# Patient Record
Sex: Male | Born: 1965 | Race: White | Hispanic: No | Marital: Single | Smoking: Current every day smoker
Health system: Southern US, Community
[De-identification: ages and names within clinical notes are randomized; demographics above are authoritative.]

## PROBLEM LIST (undated history)

## (undated) DIAGNOSIS — Z789 Other specified health status: Secondary | ICD-10-CM

## (undated) DIAGNOSIS — R17 Unspecified jaundice: Secondary | ICD-10-CM

## (undated) HISTORY — DX: Unspecified jaundice: R17

## (undated) HISTORY — PX: NO PAST SURGERIES: SHX2092

---

## 2007-10-27 ENCOUNTER — Emergency Department: Payer: Self-pay | Admitting: Emergency Medicine

## 2008-04-04 ENCOUNTER — Emergency Department: Payer: Self-pay | Admitting: Unknown Physician Specialty

## 2009-08-04 ENCOUNTER — Emergency Department: Payer: Self-pay | Admitting: Internal Medicine

## 2009-09-03 ENCOUNTER — Emergency Department: Payer: Self-pay | Admitting: Emergency Medicine

## 2010-10-05 ENCOUNTER — Emergency Department: Payer: Self-pay | Admitting: Emergency Medicine

## 2011-01-02 ENCOUNTER — Emergency Department: Payer: Self-pay | Admitting: Emergency Medicine

## 2012-03-30 ENCOUNTER — Emergency Department: Payer: Self-pay | Admitting: Emergency Medicine

## 2012-12-03 ENCOUNTER — Emergency Department: Payer: Self-pay | Admitting: Emergency Medicine

## 2012-12-06 ENCOUNTER — Emergency Department: Payer: Self-pay | Admitting: Emergency Medicine

## 2012-12-26 ENCOUNTER — Emergency Department: Payer: Self-pay | Admitting: Emergency Medicine

## 2013-11-27 ENCOUNTER — Emergency Department: Payer: Self-pay | Admitting: Emergency Medicine

## 2014-05-14 ENCOUNTER — Emergency Department: Payer: Self-pay | Admitting: Emergency Medicine

## 2014-05-18 ENCOUNTER — Emergency Department: Payer: Self-pay | Admitting: Emergency Medicine

## 2015-08-17 ENCOUNTER — Emergency Department: Payer: No Typology Code available for payment source

## 2015-08-17 ENCOUNTER — Emergency Department
Admission: EM | Admit: 2015-08-17 | Discharge: 2015-08-17 | Disposition: A | Discharge status: Home / Self Care | Payer: No Typology Code available for payment source | Attending: Emergency Medicine | Admitting: Emergency Medicine

## 2015-08-17 ENCOUNTER — Encounter: Payer: Self-pay | Admitting: Emergency Medicine

## 2015-08-17 DIAGNOSIS — F17200 Nicotine dependence, unspecified, uncomplicated: Secondary | ICD-10-CM | POA: Insufficient documentation

## 2015-08-17 DIAGNOSIS — M542 Cervicalgia: Secondary | ICD-10-CM | POA: Diagnosis present

## 2015-08-17 DIAGNOSIS — S1091XA Abrasion of unspecified part of neck, initial encounter: Secondary | ICD-10-CM | POA: Insufficient documentation

## 2015-08-17 DIAGNOSIS — Y999 Unspecified external cause status: Secondary | ICD-10-CM | POA: Insufficient documentation

## 2015-08-17 DIAGNOSIS — Y9355 Activity, bike riding: Secondary | ICD-10-CM | POA: Insufficient documentation

## 2015-08-17 DIAGNOSIS — M79671 Pain in right foot: Secondary | ICD-10-CM | POA: Diagnosis not present

## 2015-08-17 DIAGNOSIS — Y929 Unspecified place or not applicable: Secondary | ICD-10-CM | POA: Insufficient documentation

## 2015-08-17 LAB — BASIC METABOLIC PANEL
Anion gap: 6 (ref 5–15)
BUN: 17 mg/dL (ref 6–20)
CALCIUM: 9.7 mg/dL (ref 8.9–10.3)
CO2: 29 mmol/L (ref 22–32)
CREATININE: 1.2 mg/dL (ref 0.61–1.24)
Chloride: 107 mmol/L (ref 101–111)
Glucose, Bld: 100 mg/dL — ABNORMAL HIGH (ref 65–99)
Potassium: 4.6 mmol/L (ref 3.5–5.1)
Sodium: 142 mmol/L (ref 135–145)

## 2015-08-17 LAB — CBC WITH DIFFERENTIAL/PLATELET
BASOS ABS: 0.3 10*3/uL — AB (ref 0–0.1)
Basophils Relative: 4 %
Eosinophils Absolute: 0.3 10*3/uL (ref 0–0.7)
Eosinophils Relative: 4 %
HEMATOCRIT: 47.8 % (ref 40.0–52.0)
Hemoglobin: 16.5 g/dL (ref 13.0–18.0)
LYMPHS PCT: 16 %
Lymphs Abs: 1.5 10*3/uL (ref 1.0–3.6)
MCH: 30.7 pg (ref 26.0–34.0)
MCHC: 34.5 g/dL (ref 32.0–36.0)
MCV: 89.1 fL (ref 80.0–100.0)
Monocytes Absolute: 0.6 10*3/uL (ref 0.2–1.0)
Monocytes Relative: 6 %
NEUTROS ABS: 6.5 10*3/uL (ref 1.4–6.5)
Neutrophils Relative %: 70 %
Platelets: 191 10*3/uL (ref 150–440)
RBC: 5.36 MIL/uL (ref 4.40–5.90)
RDW: 13.2 % (ref 11.5–14.5)
WBC: 9.4 10*3/uL (ref 3.8–10.6)

## 2015-08-17 LAB — URIC ACID: URIC ACID, SERUM: 5.7 mg/dL (ref 4.4–7.6)

## 2015-08-17 MED ORDER — OXYCODONE-ACETAMINOPHEN 5-325 MG PO TABS: 1.0000 | ORAL_TABLET | ORAL | Status: DC | PRN

## 2015-08-17 MED ORDER — MORPHINE SULFATE (PF) 2 MG/ML IV SOLN
2.0000 mg | Freq: Once | INTRAVENOUS | Status: AC
  Administered 2015-08-17: 2 mg via INTRAVENOUS
  Filled 2015-08-17: qty 1

## 2015-08-17 MED ORDER — IOPAMIDOL (ISOVUE-370) INJECTION 76%
75.0000 mL | Freq: Once | INTRAVENOUS | Status: AC | PRN
  Administered 2015-08-17: 75 mL via INTRAVENOUS

## 2015-08-17 MED ORDER — INDOMETHACIN 50 MG PO CAPS: 50.0000 mg | ORAL_CAPSULE | Freq: Three times a day (TID) | ORAL | Status: DC

## 2015-08-17 NOTE — ED Notes (Signed)
Patient transported to CT 
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
## 2015-08-17 NOTE — ED Notes (Signed)
Returned from CT.
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
## 2015-08-17 NOTE — Discharge Instructions (Signed)
Please seek medical attention for any high fevers, chest pain, shortness of breath, change in behavior, persistent vomiting, bloody stool or any other new or concerning symptoms.  Gout Gout is when your joints become red, sore, and swell (inflamed). This is caused by the buildup of uric acid crystals in the joints. Uric acid is a chemical that is normally in the blood. If the level of uric acid gets too high in the blood, these crystals form in your joints and tissues. Over time, these crystals can form into masses near the joints and tissues. These masses can destroy bone and cause the bone to look misshapen (deformed). HOME CARE   Do not take aspirin for pain.  Only take medicine as told by your doctor.  Rest the joint as much as you can. When in bed, keep sheets and blankets off painful areas.  Keep the sore joints raised (elevated).  Put warm or cold packs on painful joints. Use of warm or cold packs depends on which works best for you.  Use crutches if the painful joint is in your leg.  Drink enough fluids to keep your pee (urine) clear or pale yellow. Limit alcohol, sugary drinks, and drinks with fructose in them.  Follow your diet instructions. Pay careful attention to how much protein you eat. Include fruits, vegetables, whole grains, and fat-free or low-fat milk products in your daily diet. Talk to your doctor or dietitian about the use of coffee, vitamin C, and cherries. These may help lower uric acid levels.  Keep a healthy body weight. GET HELP RIGHT AWAY IF:   You have watery poop (diarrhea), throw up (vomit), or have any side effects from medicines.  You do not feel better in 24 hours, or you are getting worse.  Your joint becomes suddenly more tender, and you have chills or a fever. MAKE SURE YOU:   Understand these instructions.  Will watch your condition.  Will get help right away if you are not doing well or get worse.   This information is not intended to  replace advice given to you by your health care provider. Make sure you discuss any questions you have with your health care provider.   Document Released: 12/01/2007 Document Revised: 03/14/2014 Document Reviewed: 10/05/2011 Elsevier Interactive Patient Education [employer]2016 [employer]vier Inc.

## 2015-08-17 NOTE — ED Notes (Signed)
[phone], Becky, gf of pt wants call when pt ready

## 2015-08-17 NOTE — ED Notes (Addendum)
Pain improved to 9/10

## 2015-08-17 NOTE — ED Notes (Signed)
Pt approaching staff from doorway to ask for pain meds and drink; this RNn to explain again d/t intoxication and possible surgery nothing att

## 2015-10-18 ENCOUNTER — Encounter: Payer: Self-pay | Admitting: Emergency Medicine

## 2015-10-18 ENCOUNTER — Emergency Department: Payer: Self-pay

## 2015-10-18 ENCOUNTER — Emergency Department
Admission: EM | Admit: 2015-10-18 | Discharge: 2015-10-18 | Payer: Self-pay | Attending: Student in an Organized Health Care Education/Training Program | Admitting: Student in an Organized Health Care Education/Training Program

## 2015-10-18 DIAGNOSIS — Y9389 Activity, other specified: Secondary | ICD-10-CM | POA: Insufficient documentation

## 2015-10-18 DIAGNOSIS — S06349A Traumatic hemorrhage of right cerebrum with loss of consciousness of unspecified duration, initial encounter: Secondary | ICD-10-CM

## 2015-10-18 DIAGNOSIS — F17200 Nicotine dependence, unspecified, uncomplicated: Secondary | ICD-10-CM | POA: Insufficient documentation

## 2015-10-18 DIAGNOSIS — S06361A Traumatic hemorrhage of cerebrum, unspecified, with loss of consciousness of 30 minutes or less, initial encounter: Secondary | ICD-10-CM | POA: Insufficient documentation

## 2015-10-18 DIAGNOSIS — Y990 Civilian activity done for income or pay: Secondary | ICD-10-CM | POA: Insufficient documentation

## 2015-10-18 DIAGNOSIS — G44311 Acute post-traumatic headache, intractable: Secondary | ICD-10-CM | POA: Insufficient documentation

## 2015-10-18 DIAGNOSIS — W1809XA Striking against other object with subsequent fall, initial encounter: Secondary | ICD-10-CM | POA: Insufficient documentation

## 2015-10-18 DIAGNOSIS — Y929 Unspecified place or not applicable: Secondary | ICD-10-CM | POA: Insufficient documentation

## 2015-10-18 LAB — BASIC METABOLIC PANEL
Anion gap: 5 (ref 5–15)
BUN: 15 mg/dL (ref 6–20)
CALCIUM: 9.2 mg/dL (ref 8.9–10.3)
CHLORIDE: 108 mmol/L (ref 101–111)
CO2: 24 mmol/L (ref 22–32)
CREATININE: 0.82 mg/dL (ref 0.61–1.24)
GFR calc non Af Amer: >60 mL/min (ref >60)
Glucose, Bld: 112 mg/dL — ABNORMAL HIGH (ref 65–99)
Potassium: 4.6 mmol/L (ref 3.5–5.1)
SODIUM: 137 mmol/L (ref 135–145)

## 2015-10-18 LAB — HEPATIC FUNCTION PANEL
ALK PHOS: 66 U/L (ref 38–126)
ALT: 28 U/L (ref 17–63)
AST: 34 U/L (ref 15–41)
Albumin: 4.1 g/dL (ref 3.5–5.0)
BILIRUBIN INDIRECT: 1 mg/dL — AB (ref 0.3–0.9)
BILIRUBIN TOTAL: 1.2 mg/dL (ref 0.3–1.2)
Bilirubin, Direct: 0.2 mg/dL (ref 0.1–0.5)
TOTAL PROTEIN: 7.1 g/dL (ref 6.5–8.1)

## 2015-10-18 LAB — CBC
HCT: 45.6 % (ref 40.0–52.0)
HEMOGLOBIN: 16.1 g/dL (ref 13.0–18.0)
MCH: 31.4 pg (ref 26.0–34.0)
MCHC: 35.2 g/dL (ref 32.0–36.0)
MCV: 89.2 fL (ref 80.0–100.0)
Platelets: 169 10*3/uL (ref 150–440)
RBC: 5.11 MIL/uL (ref 4.40–5.90)
RDW: 13.6 % (ref 11.5–14.5)
WBC: 7.3 10*3/uL (ref 3.8–10.6)

## 2015-10-18 LAB — ACETAMINOPHEN LEVEL

## 2015-10-18 MED ORDER — SODIUM CHLORIDE 0.9 % IV BOLUS (SEPSIS)
1000.0000 mL | Freq: Once | INTRAVENOUS | Status: AC
  Administered 2015-10-18: 1000 mL via INTRAVENOUS

## 2015-10-18 MED ORDER — DIPHENHYDRAMINE HCL 50 MG/ML IJ SOLN
25.0000 mg | Freq: Once | INTRAMUSCULAR | Status: AC
  Administered 2015-10-18: 25 mg via INTRAVENOUS
  Filled 2015-10-18: qty 1

## 2015-10-18 MED ORDER — ACETAMINOPHEN 500 MG PO TABS
1000.0000 mg | ORAL_TABLET | Freq: Once | ORAL | Status: DC
  Filled 2015-10-18: qty 2

## 2015-10-18 MED ORDER — METOCLOPRAMIDE HCL 5 MG/ML IJ SOLN
10.0000 mg | Freq: Once | INTRAMUSCULAR | Status: AC
  Administered 2015-10-18: 10 mg via INTRAVENOUS
  Filled 2015-10-18: qty 2

## 2015-10-18 MED ORDER — FENTANYL CITRATE (PF) 100 MCG/2ML IJ SOLN
100.0000 ug | INTRAMUSCULAR | Status: DC | PRN
Start: 2015-10-18 — End: 2015-10-19
  Administered 2015-10-18 (×2): 100 ug via INTRAVENOUS
  Filled 2015-10-18 (×2): qty 2

## 2015-10-18 NOTE — ED Notes (Signed)
[facility]. Pt [facility] to stretcher [facility]

## 2015-10-18 NOTE — ED Notes (Signed)
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 Pt states he did have some LOC. Ecchymosis noted around right eye. Pt states it is hard for him to lay down due to discomfort.  Pt states he has been taking Tylenol and his grandmother gave him some percocet which did not help with the pain either.

## 2015-10-18 NOTE — ED Notes (Signed)
Pt reports pain at 9/10 at this time. Pain medication offered.

## 2015-10-18 NOTE — ED Triage Notes (Signed)
Pt presents to ED for a fall after painting on Thursday; pt was not seen.  Pt states his head "hurts so bad" where he can't lay down for long. Presents with right eye bruising as well. " I think I blacked out or something". Pain 10/10
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
## 2015-10-18 NOTE — ED Notes (Signed)
Attempted IV access x1 unsuccessful. Primary RN to attempt.

## 2015-10-18 NOTE — ED Notes (Signed)
[facility].

## 2015-10-19 ENCOUNTER — Emergency Department
Admit: 2015-10-19 | Discharge: 2015-10-19 | Discharge status: Absent without leave | Payer: No Typology Code available for payment source

## 2015-10-19 DIAGNOSIS — S06339A Contusion and laceration of cerebrum, unspecified, with loss of consciousness of unspecified duration, initial encounter: Secondary | ICD-10-CM | POA: Insufficient documentation

## 2018-01-19 ENCOUNTER — Emergency Department
Admission: EM | Admit: 2018-01-19 | Discharge: 2018-01-19 | Disposition: A | Discharge status: Home / Self Care | Payer: Self-pay | Attending: Emergency Medicine | Admitting: Emergency Medicine

## 2018-01-19 ENCOUNTER — Encounter: Payer: Self-pay | Admitting: Emergency Medicine

## 2018-01-19 ENCOUNTER — Other Ambulatory Visit: Payer: Self-pay

## 2018-01-19 DIAGNOSIS — F17200 Nicotine dependence, unspecified, uncomplicated: Secondary | ICD-10-CM | POA: Insufficient documentation

## 2018-01-19 DIAGNOSIS — Z79899 Other long term (current) drug therapy: Secondary | ICD-10-CM | POA: Insufficient documentation

## 2018-01-19 DIAGNOSIS — K029 Dental caries, unspecified: Secondary | ICD-10-CM | POA: Insufficient documentation

## 2018-01-19 DIAGNOSIS — K0889 Other specified disorders of teeth and supporting structures: Secondary | ICD-10-CM | POA: Insufficient documentation

## 2018-01-19 MED ORDER — IBUPROFEN 600 MG PO TABS: 600.0000 mg | ORAL_TABLET | Freq: Three times a day (TID) | ORAL | 0 refills | Status: DC | PRN

## 2018-01-19 MED ORDER — LIDOCAINE VISCOUS HCL 2 % MT SOLN
15.0000 mL | Freq: Once | OROMUCOSAL | Status: AC
  Administered 2018-01-19: 15 mL via OROMUCOSAL
  Filled 2018-01-19: qty 15

## 2018-01-19 MED ORDER — AMOXICILLIN 500 MG PO CAPS: 500.0000 mg | ORAL_CAPSULE | Freq: Three times a day (TID) | ORAL | 0 refills | Status: DC

## 2018-01-19 MED ORDER — OXYCODONE-ACETAMINOPHEN 7.5-325 MG PO TABS: 1.0000 | ORAL_TABLET | Freq: Four times a day (QID) | ORAL | 0 refills | Status: DC | PRN

## 2018-01-19 NOTE — ED Provider Notes (Signed)
[facility] Emergency Department Provider Note   ____________________________________________   First MD Initiated Contact with Patient 01/19/18 1509     (approximate)  I have reviewed the triage vital signs and the nursing notes.   HISTORY  Chief Complaint Dental Pain    HPI Ross Reed is a 52 y.o. male patient complained of dental pain which began last night.  Patient also has some mild edema to the right lateral aspect of the face.  Patient has a long history of dental neglect.  Patient denies fever associated this complaint.  Patient denies any drainage.  Patient rates the pain as a 10/10.  Patient described the pain is "ache".  No palliative measure for complaint.   History reviewed. No pertinent past medical history.  There are no active problems to display for this patient.   History reviewed. No pertinent surgical history.  Prior to Admission medications   Medication Sig Start Date End Date Taking? Authorizing Provider  amoxicillin (AMOXIL) 500 MG capsule Take 1 capsule (500 mg total) by mouth 3 (three) times daily. 01/19/18   Joni Reining, PA-C  ibuprofen (ADVIL,MOTRIN) 600 MG tablet Take 1 tablet (600 mg total) by mouth every 8 (eight) hours as needed. 01/19/18   Joni Reining, PA-C  indomethacin (INDOCIN) 50 MG capsule Take 1 capsule (50 mg total) by mouth 3 (three) times daily with meals. 08/17/15   Phineas Semen, MD  oxyCODONE-acetaminophen (PERCOCET) 7.5-325 MG tablet Take 1 tablet by mouth every 6 (six) hours as needed. 01/19/18   Joni Reining, PA-C  oxyCODONE-acetaminophen (ROXICET) 5-325 MG tablet Take 1 tablet by mouth every 4 (four) hours as needed for severe pain. 08/17/15   Phineas Semen, MD    Allergies Patient has no known allergies.  No family history on file.  Social History Social History   Tobacco Use  . Smoking status: Current Every Day Smoker  . Smokeless tobacco: Never Used  Substance Use Topics  .  Alcohol use: Yes  . Drug use: Not on file    Review of Systems Constitutional: No fever/chills Eyes: No visual changes. ENT: No sore throat.  Dental pain. Cardiovascular: Denies chest pain. Respiratory: Denies shortness of breath. Gastrointestinal: No abdominal pain.  No nausea, no vomiting.  No diarrhea.  No constipation. Genitourinary: Negative for dysuria. Musculoskeletal: Negative for back pain. Skin: Negative for rash. Neurological: Negative for headaches, focal weakness or numbness.   ____________________________________________   PHYSICAL EXAM:  VITAL SIGNS: ED Triage Vitals  Enc Vitals Group     BP 01/19/18 1507 (!) 154/90     Pulse Rate 01/19/18 1507 91     Resp 01/19/18 1507 18     Temp 01/19/18 1507 97.7 F (36.5 C)     Temp Source 01/19/18 1507 Oral     SpO2 01/19/18 1507 98 %     Weight 01/19/18 1508 192 lb (87.1 kg)     Height 01/19/18 1508 6\' 3"  (1.905 m)     Head Circumference --      Peak Flow --      Pain Score 01/19/18 1508 10     Pain Loc --      Pain Edu? --      Excl. in GC? --    Constitutional: Alert and oriented. Well appearing and in no acute distress. Eyes: Conjunctivae are normal. PERRL. EOMI. Head: Atraumatic. Nose: No congestion/rhinnorhea. Mouth/Throat: Mucous membranes are moist.  Oropharynx non-erythematous.  Multiple fractures and caries dental cavity.  Mild gingival edema. Neck: No stridor.  Hematological/Lymphatic/Immunilogical: No cervical lymphadenopathy. Cardiovascular: Normal rate, regular rhythm. Grossly normal heart sounds.  Good peripheral circulation. Respiratory: Normal respiratory effort.  No retractions. Lungs CTAB.  ____________________________________________   LABS (all labs ordered are listed, but only abnormal results are displayed)  Labs Reviewed - No data to display ____________________________________________  EKG   ____________________________________________  RADIOLOGY  ED MD interpretation:     Official radiology report(s): No results found.  ____________________________________________   PROCEDURES  Procedure(s) performed: None  Procedures  Critical Care performed: No  ____________________________________________   INITIAL IMPRESSION / ASSESSMENT AND PLAN / ED COURSE  As part of my medical decision making, I reviewed the following data within the electronic MEDICAL RECORD NUMBER    Teeth pain secondary to poor dental hygiene and multiple caries.  Patient given discharge care instruction advised follow-up from list of dental clinics provided.      ____________________________________________   FINAL CLINICAL IMPRESSION(S) / ED DIAGNOSES  Final diagnoses:  Pain due to dental caries     ED Discharge Orders         Ordered    amoxicillin (AMOXIL) 500 MG capsule  3 times daily     01/19/18 1534    oxyCODONE-acetaminophen (PERCOCET) 7.5-325 MG tablet  Every 6 hours PRN     01/19/18 1534    ibuprofen (ADVIL,MOTRIN) 600 MG tablet  Every 8 hours PRN     01/19/18 1534           Note:  This document was prepared using Dragon voice recognition software and may include unintentional dictation errors.    Joni ReiningSmith, Stevana Dufner K, PA-C 01/19/18 1539    Jeanmarie PlantMcShane, James A, MD 01/19/18 [phone]

## 2018-01-19 NOTE — Discharge Instructions (Addendum)
Take medication as directed follow-up from list of dental clinics provided. °OPTIONS FOR DENTAL FOLLOW UP CARE ° °[place] Department of Health and Human Services - Local Safety Net Dental Clinics °http://www.ncdhhs.gov/dph/oralhealth/services/safetynetclinics.htm °  °[facility] (336-562-3123) ° °[facility] ([phone]) ° °[facility] ([phone] ext [phone]) ° °[facility] ([phone]) ° °[facility] ([phone]) °This clinic caters to the indigent population and is on a lottery system. °Location: °[facility], [address], [place] °Clinic Hours: °Wednesdays from 6pm - 9pm, patients seen by a lottery system. °For dates, call or go to www.med.[facility].edu/[facility]/patients/Dental-[facility] °Services: °Cleanings, fillings and simple extractions. °Payment Options: °DENTAL WORK IS FREE OF CHARGE. Bring proof of income or support. °Best way to get seen: °Arrive at 5:15 pm - this is a lottery, NOT first come/first serve, so arriving earlier will not increase your chances of being seen. °  °  °[facility] °[phone] °Select option 1 for emergencies °  °Location: °[facility], [address], [place] °Clinic Hours: °No walk-ins accepted - call the day before to schedule an appointment. °Check in times are 9:30 am and 1:30 pm. °Services: °Simple extractions, temporary fillings, pulpectomy/pulp debridement, uncomplicated abscess drainage. °Payment Options: °PAYMENT IS DUE AT THE TIME OF SERVICE.  Fee is usually $100-200, additional surgical procedures (e.g. abscess drainage) may be extra. °Cash, checks, Visa/MasterCard accepted.  Can file Medicaid if patient is covered for dental - patient should call case worker to check. °No discount for [facility] patients. °Best way to get seen: °MUST call the day before and get onto the schedule. Can usually be seen the next 1-2 days. No  walk-ins accepted. °  °  °[facility] °[phone] °  °Location: °[facility], [address], [place] °Clinic Hours: °M, W, Th, F 8am or 1:30pm, Tues 9a or 1:30 - first come/first served. °Services: °Simple extractions, temporary fillings, uncomplicated abscess drainage.  You do not need to be an [place] resident. °Payment Options: °PAYMENT IS DUE AT THE TIME OF SERVICE. °Dental insurance, otherwise sliding scale - bring proof of income or support. °Depending on income and treatment needed, cost is usually $50-200. °Best way to get seen: °Arrive early as it is first come/first served. °  °  °[facility] °[phone] °  °Location: °[address] °Clinic Hours: °Mon-Thu 8a-5p °Services: °Most basic dental services including extractions and fillings. °Payment Options: °PAYMENT IS DUE AT THE TIME OF SERVICE. °Sliding scale, up to 50% off - bring proof if income or support. °Medicaid with dental option accepted. °Best way to get seen: °Call to schedule an appointment, can usually be seen within 2 weeks OR they will try to see walk-ins - show up at 8a or 2p (you may have to wait). °  °  °[facility] °[phone] °[place] RESIDENTS ONLY °  °Location: °[facility], [address], [place], [place] [address] °Clinic Hours: By appointment only. °Monday - Thursday 8am-5pm, Friday 8am-12pm °Services: Cleanings, fillings, extractions. °Payment Options: °PAYMENT IS DUE AT THE TIME OF SERVICE. °Cash, Visa or MasterCard. Sliding scale - $30 minimum per service. °Best way to get seen: °Come in to office, complete packet and make an appointment - need proof of income °or support monies for each household member and proof of [place] residence. °Usually takes about a month to get in. °  °  °[facility] [phone]   Location: [address] [facility] Hours:  Walk-in Urgent Care Dental Services are offered Monday-Friday mornings only. The numbers of emergencies accepted daily is limited to the number of providers available. Maximum 15 - Mondays, Wednesdays & Thursdays Maximum 10 - Tuesdays & Fridays Services: You do not need to be [place] resident to be seen for a dental emergency. Emergencies are defined as pain, swelling, abnormal bleeding, or dental trauma. Walkins will receive x-rays if needed. NOTE: Dental cleaning is not an emergency. Payment Options: PAYMENT IS DUE AT THE TIME OF SERVICE. Minimum co-pay is $40.00 for uninsured patients. Minimum co-pay is $3.00 for Medicaid with dental coverage. Dental Insurance is accepted and must be presented at time of visit. Medicare does not cover dental. Forms of payment: Cash, credit card, checks. Best way to get seen: If not previously registered with the clinic, walk-in dental registration begins at 7:15 am and is on a first come/first serve basis. If previously registered with the clinic, call to make an appointment.     [facility] [phone] [place] RESIDENTS ONLY   Location: [address], [place], [place] Clinic Hours: Mon-Thu 10a-2p Services: Extractions only! Payment Options: FREE (donations accepted) - bring proof of income or support Best way to get seen: Call and schedule an appointment OR come at 8am on the 1st Monday of every month (except for holidays) when it is first come/first served.     [facility] [phone]   Location: [address], [place] [facility] Hours: Friday mornings Services, Payment Options, Best way to get seen: Call for info

## 2018-01-19 NOTE — ED Notes (Signed)
Pt swishing lidocaine at this time. Pt aware of discharge papers and plan. Pt remains in room until medication is complete.

## 2018-01-19 NOTE — ED Triage Notes (Signed)
Dental pain that began last night swollen right side of face.

## 2019-10-16 ENCOUNTER — Other Ambulatory Visit: Payer: Self-pay

## 2019-10-16 ENCOUNTER — Emergency Department
Admission: EM | Admit: 2019-10-16 | Discharge: 2019-10-16 | Disposition: A | Discharge status: Home / Self Care | Payer: Self-pay | Attending: Emergency Medicine | Admitting: Emergency Medicine

## 2019-10-16 ENCOUNTER — Encounter: Payer: Self-pay | Admitting: *Deleted

## 2019-10-16 DIAGNOSIS — Y999 Unspecified external cause status: Secondary | ICD-10-CM | POA: Insufficient documentation

## 2019-10-16 DIAGNOSIS — Y929 Unspecified place or not applicable: Secondary | ICD-10-CM | POA: Insufficient documentation

## 2019-10-16 DIAGNOSIS — S8992XA Unspecified injury of left lower leg, initial encounter: Secondary | ICD-10-CM | POA: Diagnosis not present

## 2019-10-16 DIAGNOSIS — F17210 Nicotine dependence, cigarettes, uncomplicated: Secondary | ICD-10-CM | POA: Insufficient documentation

## 2019-10-16 DIAGNOSIS — Y9355 Activity, bike riding: Secondary | ICD-10-CM | POA: Insufficient documentation

## 2019-10-16 DIAGNOSIS — S6992XA Unspecified injury of left wrist, hand and finger(s), initial encounter: Secondary | ICD-10-CM | POA: Diagnosis not present

## 2019-10-16 DIAGNOSIS — M25532 Pain in left wrist: Secondary | ICD-10-CM | POA: Diagnosis present

## 2019-10-16 MED ORDER — OXYCODONE-ACETAMINOPHEN 5-325 MG PO TABS: 1.0000 | ORAL_TABLET | Freq: Four times a day (QID) | ORAL | 0 refills | Status: DC | PRN

## 2019-10-16 NOTE — ED Notes (Signed)
Pt unable to sign E-signature due to signature pad malfunction. Pt verbalized understanding of d/c instructions and had no additional questions or concerns for this RN or provider. Pt left with d/c instructions and gathered all personal belongings from room and removed them prior to ED departure.   
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
## 2020-05-08 ENCOUNTER — Other Ambulatory Visit: Payer: Self-pay

## 2020-05-08 ENCOUNTER — Emergency Department
Admission: EM | Admit: 2020-05-08 | Discharge: 2020-05-08 | Disposition: A | Discharge status: Home / Self Care | Payer: BC Managed Care – PPO | Attending: Emergency Medicine | Admitting: Emergency Medicine

## 2020-05-08 ENCOUNTER — Encounter: Payer: Self-pay | Admitting: Emergency Medicine

## 2020-05-08 DIAGNOSIS — R21 Rash and other nonspecific skin eruption: Secondary | ICD-10-CM

## 2020-05-08 DIAGNOSIS — B070 Plantar wart: Secondary | ICD-10-CM | POA: Diagnosis not present

## 2020-05-08 DIAGNOSIS — F17200 Nicotine dependence, unspecified, uncomplicated: Secondary | ICD-10-CM | POA: Insufficient documentation

## 2020-05-08 MED ORDER — METHYLPREDNISOLONE SODIUM SUCC 125 MG IJ SOLR
125.0000 mg | Freq: Once | INTRAMUSCULAR | Status: AC
  Administered 2020-05-08: 125 mg via INTRAMUSCULAR
  Filled 2020-05-08: qty 2

## 2020-05-08 MED ORDER — HYDROXYZINE HCL 50 MG PO TABS
50.0000 mg | ORAL_TABLET | Freq: Once | ORAL | Status: AC
  Administered 2020-05-08: 50 mg via ORAL
  Filled 2020-05-08: qty 1

## 2020-05-08 MED ORDER — METHYLPREDNISOLONE 4 MG PO TBPK: ORAL_TABLET | ORAL | 0 refills | Status: DC

## 2020-05-08 MED ORDER — HYDROXYZINE HCL 50 MG PO TABS: 50.0000 mg | ORAL_TABLET | Freq: Three times a day (TID) | ORAL | 0 refills | Status: DC | PRN

## 2020-05-08 MED ORDER — SALICYLIC ACID 0.5 % EX PADS: 1.0000 | MEDICATED_PAD | Freq: Every day | CUTANEOUS | 1 refills | Status: DC

## 2020-05-08 NOTE — ED Notes (Signed)
Patient declined discharge vital signs. 

## 2020-05-08 NOTE — Discharge Instructions (Signed)
Follow discharge care instruction and call the dermatologist listed in your discharge.  Take medication as directed.

## 2020-05-08 NOTE — ED Triage Notes (Signed)
Pt c/o rash to trunk and legs, pt states rash x 1 week. Pt denies itching at this time. Pt with red bumps noted at this time. Pt states works at a [employer].

## 2020-05-08 NOTE — ED Provider Notes (Signed)
[facility] Emergency Department Provider Note   ____________________________________________   Event Date/Time   First MD Initiated Contact with Patient 05/08/20 1128     (approximate)  I have reviewed the triage vital signs and the nursing notes.   HISTORY  Chief Complaint Rash    HPI Denham Mose is a 55 y.o. male patient presents with diffuse rash during the neck and facial area for 1 week.  Patient stated no itchiness associated with the rash.  Patient that he works in a [employer].  Unsure for fall.  Allergen for complaint.  Patient also complain of lesion to bilateral plantar aspect of foot.  Patient states pain increased with weightbearing and ambulation.  No palliative measure for either complaint.         History reviewed. No pertinent past medical history.  There are no problems to display for this patient.   No past surgical history on file.  Prior to Admission medications   Medication Sig Start Date End Date Taking? Authorizing Provider  hydrOXYzine (ATARAX/VISTARIL) 50 MG tablet Take 1 tablet (50 mg total) by mouth 3 (three) times daily as needed. 05/08/20  Yes Joni Reining, PA-C  methylPREDNISolone (MEDROL DOSEPAK) 4 MG TBPK tablet Take Tapered dose as directed 05/08/20  Yes Joni Reining, PA-C  Salicylic Acid 0.5 % PADS Apply 1 patch topically daily. 05/08/20  Yes Joni Reining, PA-C    Allergies Patient has no known allergies.  No family history on file.  Social History Social History   Tobacco Use  . Smoking status: Current Every Day Smoker  . Smokeless tobacco: Never Used  Substance Use Topics  . Alcohol use: Yes    Comment: Daily  . Drug use: Yes    Types: Marijuana    Comment: Occ    Review of Systems Constitutional: No fever/chills Eyes: No visual changes. ENT: No sore throat. Cardiovascular: Denies chest pain. Respiratory: Denies shortness of breath. Gastrointestinal: No abdominal pain.  No nausea, no  vomiting.  No diarrhea.  No constipation. Genitourinary: Negative for dysuria. Musculoskeletal: Negative for back pain. Skin: Positive for rash. Neurological: Negative for headaches, focal weakness or numbness.  ____________________________________________   PHYSICAL EXAM:  VITAL SIGNS: ED Triage Vitals [05/08/20 1120]  Enc Vitals Group     BP 119/87     Pulse Rate 95     Resp 20     Temp 97.7 F (36.5 C)     Temp Source Oral     SpO2 97 %     Weight 212 lb (96.2 kg)     Height 6\' 3"  (1.905 m)     Head Circumference      Peak Flow      Pain Score 0     Pain Loc      Pain Edu?      Excl. in GC?    Constitutional: Alert and oriented. Well appearing and in no acute distress. Eyes: Conjunctivae are normal. PERRL. EOMI. Head: Atraumatic. Nose: No congestion/rhinnorhea. Mouth/Throat: Mucous membranes are moist.  Oropharynx non-erythematous. Neck: No stridor. Hematological/Lymphatic/Immunilogical: No cervical lymphadenopathy. Cardiovascular: Normal rate, regular rhythm. Grossly normal heart sounds.  Good peripheral circulation. Respiratory: Normal respiratory effort.  No retractions. Lungs CTAB. Gastrointestinal: Soft and nontender. No distention. No abdominal bruits. No CVA tenderness. Musculoskeletal: No lower extremity tenderness nor edema.  No joint effusions. Neurologic:  Normal speech and language. No gross focal neurologic deficits are appreciated. No gait instability. Skin:  Skin is warm, dry  and intact.  Diffuse maculopapular lesions on the body.  Patient also papular lesions on the bilateral foot consistent with plantars warts.   Psychiatric: Mood and affect are normal. Speech and behavior are normal.  ____________________________________________   LABS (all labs ordered are listed, but only abnormal results are displayed)  Labs Reviewed - No data to  display ____________________________________________  EKG   ____________________________________________  RADIOLOGY I, Joni Reining, personally viewed and evaluated these images (plain radiographs) as part of my medical decision making, as well as reviewing the written report by the radiologist.  ED MD interpretation:    Official radiology report(s): No results found.  ____________________________________________   PROCEDURES  Procedure(s) performed (including Critical Care):  Procedures   ____________________________________________   INITIAL IMPRESSION / ASSESSMENT AND PLAN / ED COURSE  As part of my medical decision making, I reviewed the following data within the electronic MEDICAL RECORD NUMBER         Patient presents with diffuse rash for 1 week status post work in [employer].  No itching associated complaint.  Patient also has lesions to the plantar aspect of the feet.  Patient complaint physical exam consistent with nonspecific contact dermatitis and plantars warts.  Patient given prescription for Medrol Dosepak and Atarax.  Patient consulted to dermatology for definitive evaluation.  Patient also given prescription for salicylic pads.      ____________________________________________   FINAL CLINICAL IMPRESSION(S) / ED DIAGNOSES  Final diagnoses:  Rash and nonspecific skin eruption  Plantar warts     ED Discharge Orders         Ordered    methylPREDNISolone (MEDROL DOSEPAK) 4 MG TBPK tablet        05/08/20 1153    hydrOXYzine (ATARAX/VISTARIL) 50 MG tablet  3 times daily PRN        05/08/20 1153    Salicylic Acid 0.5 % PADS  Daily        05/08/20 1153          *Please note:  Jarick Harkins was evaluated in Emergency Department on 05/08/2020 for the symptoms described in the history of present illness. He was evaluated in the context of the global COVID-19 pandemic, which necessitated consideration that the patient might be at risk for infection  with the SARS-CoV-2 virus that causes COVID-19. Institutional protocols and algorithms that pertain to the evaluation of patients at risk for COVID-19 are in a state of rapid change based on information released by regulatory bodies including the CDC and federal and state organizations. These policies and algorithms were followed during the patient's care in the ED.  Some ED evaluations and interventions may be delayed as a result of limited staffing during and the pandemic.*   Note:  This document was prepared using Dragon voice recognition software and may include unintentional dictation errors.    Joni Reining, PA-C 05/08/20 1202    Dionne Bucy, MD 05/08/20 [phone]

## 2020-05-08 NOTE — ED Notes (Signed)
See triage note  Presents with generalized rash    States he developed rash about 1 week ago  No resp distress

## 2021-06-13 ENCOUNTER — Other Ambulatory Visit: Payer: Self-pay

## 2021-06-13 ENCOUNTER — Emergency Department: Payer: 59

## 2021-06-13 ENCOUNTER — Emergency Department
Admission: EM | Admit: 2021-06-13 | Discharge: 2021-06-13 | Disposition: A | Discharge status: Home / Self Care | Payer: 59 | Attending: Emergency Medicine | Admitting: Emergency Medicine

## 2021-06-13 DIAGNOSIS — R69 Illness, unspecified: Secondary | ICD-10-CM | POA: Diagnosis not present

## 2021-06-13 DIAGNOSIS — Z0001 Encounter for general adult medical examination with abnormal findings: Secondary | ICD-10-CM | POA: Diagnosis not present

## 2021-06-13 DIAGNOSIS — Y906 Blood alcohol level of 120-199 mg/100 ml: Secondary | ICD-10-CM | POA: Insufficient documentation

## 2021-06-13 DIAGNOSIS — F10920 Alcohol use, unspecified with intoxication, uncomplicated: Secondary | ICD-10-CM

## 2021-06-13 DIAGNOSIS — Y92410 Unspecified street and highway as the place of occurrence of the external cause: Secondary | ICD-10-CM | POA: Diagnosis not present

## 2021-06-13 DIAGNOSIS — Z139 Encounter for screening, unspecified: Secondary | ICD-10-CM

## 2021-06-13 DIAGNOSIS — F10129 Alcohol abuse with intoxication, unspecified: Secondary | ICD-10-CM | POA: Diagnosis present

## 2021-06-13 LAB — CBC WITH DIFFERENTIAL/PLATELET
Abs Immature Granulocytes: 0.06 10*3/uL (ref 0.00–0.07)
Basophils Absolute: 0.1 10*3/uL (ref 0.0–0.1)
Basophils Relative: 1 %
Eosinophils Absolute: 0.3 10*3/uL (ref 0.0–0.5)
Eosinophils Relative: 3 %
HCT: 47.9 % (ref 39.0–52.0)
Hemoglobin: 16.1 g/dL (ref 13.0–17.0)
Immature Granulocytes: 1 %
Lymphocytes Relative: 26 %
Lymphs Abs: 2.3 10*3/uL (ref 0.7–4.0)
MCH: 29.9 pg (ref 26.0–34.0)
MCHC: 33.6 g/dL (ref 30.0–36.0)
MCV: 89 fL (ref 80.0–100.0)
Monocytes Absolute: 0.6 10*3/uL (ref 0.1–1.0)
Monocytes Relative: 7 %
Neutro Abs: 5.6 10*3/uL (ref 1.7–7.7)
Neutrophils Relative %: 62 %
Platelets: 250 10*3/uL (ref 150–400)
RBC: 5.38 MIL/uL (ref 4.22–5.81)
RDW: 12.4 % (ref 11.5–15.5)
WBC: 9 10*3/uL (ref 4.0–10.5)
nRBC: 0 % (ref 0.0–0.2)

## 2021-06-13 LAB — ETHANOL: Alcohol, Ethyl (B): 199 mg/dL — ABNORMAL HIGH (ref <10)

## 2021-06-13 LAB — COMPREHENSIVE METABOLIC PANEL
ALT: 55 U/L — ABNORMAL HIGH (ref 0–44)
AST: 48 U/L — ABNORMAL HIGH (ref 15–41)
Albumin: 4.1 g/dL (ref 3.5–5.0)
Alkaline Phosphatase: 77 U/L (ref 38–126)
Anion gap: 9 (ref 5–15)
BUN: 10 mg/dL (ref 6–20)
CO2: 24 mmol/L (ref 22–32)
Calcium: 9.2 mg/dL (ref 8.9–10.3)
Chloride: 105 mmol/L (ref 98–111)
Creatinine, Ser: 0.93 mg/dL (ref 0.61–1.24)
GFR, Estimated: >60 mL/min (ref >60)
Glucose, Bld: 109 mg/dL — ABNORMAL HIGH (ref 70–99)
Potassium: 4.2 mmol/L (ref 3.5–5.1)
Sodium: 138 mmol/L (ref 135–145)
Total Bilirubin: 0.8 mg/dL (ref 0.3–1.2)
Total Protein: 8.2 g/dL — ABNORMAL HIGH (ref 6.5–8.1)
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
## 2021-06-27 ENCOUNTER — Emergency Department
Admission: EM | Admit: 2021-06-27 | Discharge: 2021-06-27 | Disposition: A | Discharge status: Home / Self Care | Payer: 59 | Attending: Emergency Medicine | Admitting: Emergency Medicine

## 2021-06-27 ENCOUNTER — Emergency Department: Payer: 59

## 2021-06-27 ENCOUNTER — Other Ambulatory Visit: Payer: Self-pay

## 2021-06-27 DIAGNOSIS — S161XXA Strain of muscle, fascia and tendon at neck level, initial encounter: Secondary | ICD-10-CM | POA: Insufficient documentation

## 2021-06-27 DIAGNOSIS — R9431 Abnormal electrocardiogram [ECG] [EKG]: Secondary | ICD-10-CM | POA: Diagnosis not present

## 2021-06-27 DIAGNOSIS — S199XXA Unspecified injury of neck, initial encounter: Secondary | ICD-10-CM | POA: Diagnosis not present

## 2021-06-27 DIAGNOSIS — R519 Headache, unspecified: Secondary | ICD-10-CM | POA: Diagnosis not present

## 2021-06-27 DIAGNOSIS — K0889 Other specified disorders of teeth and supporting structures: Secondary | ICD-10-CM | POA: Insufficient documentation

## 2021-06-27 DIAGNOSIS — S39012A Strain of muscle, fascia and tendon of lower back, initial encounter: Secondary | ICD-10-CM | POA: Diagnosis not present

## 2021-06-27 DIAGNOSIS — M542 Cervicalgia: Secondary | ICD-10-CM | POA: Diagnosis not present

## 2021-06-27 DIAGNOSIS — Y92410 Unspecified street and highway as the place of occurrence of the external cause: Secondary | ICD-10-CM | POA: Insufficient documentation

## 2021-06-27 MED ORDER — CYCLOBENZAPRINE HCL 10 MG PO TABS: 10.0000 mg | ORAL_TABLET | Freq: Three times a day (TID) | ORAL | 0 refills | Status: AC | PRN

## 2021-06-27 MED ORDER — OXYCODONE-ACETAMINOPHEN 5-325 MG PO TABS: 1.0000 | ORAL_TABLET | Freq: Four times a day (QID) | ORAL | 0 refills | Status: AC | PRN

## 2021-06-27 MED ORDER — KETOROLAC TROMETHAMINE 15 MG/ML IJ SOLN
15.0000 mg | Freq: Once | INTRAMUSCULAR | Status: AC
  Administered 2021-06-27: 15 mg via INTRAMUSCULAR
  Filled 2021-06-27: qty 1

## 2021-06-27 MED ORDER — CYCLOBENZAPRINE HCL 10 MG PO TABS
5.0000 mg | ORAL_TABLET | Freq: Once | ORAL | Status: AC
  Administered 2021-06-27: 5 mg via ORAL
  Filled 2021-06-27: qty 1

## 2021-06-27 MED ORDER — LIDOCAINE 5 % EX PTCH: 1.0000 | MEDICATED_PATCH | Freq: Two times a day (BID) | CUTANEOUS | 11 refills | Status: DC
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
## 2021-06-27 NOTE — ED Notes (Signed)
Pt states he takes 2 percocet a day due to pain.  ?

## 2021-06-27 NOTE — Discharge Instructions (Addendum)
-  Take ibuprofen and lidocaine patches as needed for pain.  Use oxycodone/acetaminophen sparingly. ?-You may additionally take cyclobenzaprine for muscle relaxation. ?-Please follow-up with your dental provider, as discussed. ?-Return to the emergency department anytime if you begin to experience any new or worsening symptoms ?
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
## 2022-04-29 ENCOUNTER — Emergency Department: Payer: 59

## 2022-04-29 ENCOUNTER — Other Ambulatory Visit: Payer: Self-pay

## 2022-04-29 DIAGNOSIS — S36039A Unspecified laceration of spleen, initial encounter: Principal | ICD-10-CM | POA: Diagnosis present

## 2022-04-29 DIAGNOSIS — S41112A Laceration without foreign body of left upper arm, initial encounter: Secondary | ICD-10-CM | POA: Diagnosis present

## 2022-04-29 DIAGNOSIS — Z23 Encounter for immunization: Secondary | ICD-10-CM

## 2022-04-29 DIAGNOSIS — Z79899 Other long term (current) drug therapy: Secondary | ICD-10-CM

## 2022-04-29 DIAGNOSIS — S41119A Laceration without foreign body of unspecified upper arm, initial encounter: Secondary | ICD-10-CM | POA: Diagnosis not present

## 2022-04-29 DIAGNOSIS — Y9301 Activity, walking, marching and hiking: Secondary | ICD-10-CM | POA: Diagnosis present

## 2022-04-29 DIAGNOSIS — R9431 Abnormal electrocardiogram [ECG] [EKG]: Secondary | ICD-10-CM | POA: Diagnosis not present

## 2022-04-29 DIAGNOSIS — Y92410 Unspecified street and highway as the place of occurrence of the external cause: Secondary | ICD-10-CM
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
## 2022-04-30 ENCOUNTER — Encounter (HOSPITAL_COMMUNITY): Payer: Self-pay

## 2022-04-30 ENCOUNTER — Inpatient Hospital Stay (HOSPITAL_COMMUNITY)
Admission: EM | Admit: 2022-04-30 | Discharge: 2022-05-02 | DRG: 799 | Disposition: A | Discharge status: Home / Self Care | Payer: 59 | Source: Ambulatory Visit | Attending: Surgery | Admitting: Surgery

## 2022-04-30 ENCOUNTER — Encounter (HOSPITAL_COMMUNITY): Admission: EM | Disposition: A | Discharge status: Home / Self Care | Payer: Self-pay | Source: Ambulatory Visit

## 2022-04-30 ENCOUNTER — Other Ambulatory Visit: Payer: Self-pay

## 2022-04-30 ENCOUNTER — Inpatient Hospital Stay
Admission: EM | Admit: 2022-04-30 | Discharge: 2022-04-30 | DRG: 816 | Disposition: A | Discharge status: Other Acute Inpatient Hospital | Payer: 59 | Attending: Emergency Medicine | Admitting: Emergency Medicine

## 2022-04-30 ENCOUNTER — Inpatient Hospital Stay (HOSPITAL_COMMUNITY): Payer: 59 | Admitting: Critical Care Medicine

## 2022-04-30 ENCOUNTER — Emergency Department: Payer: 59

## 2022-04-30 DIAGNOSIS — S3609XA Other injury of spleen, initial encounter: Secondary | ICD-10-CM | POA: Diagnosis not present

## 2022-04-30 DIAGNOSIS — S36039A Unspecified laceration of spleen, initial encounter: Secondary | ICD-10-CM | POA: Diagnosis not present

## 2022-04-30 DIAGNOSIS — K661 Hemoperitoneum: Secondary | ICD-10-CM | POA: Diagnosis not present

## 2022-04-30 DIAGNOSIS — S36030A Superficial (capsular) laceration of spleen, initial encounter: Secondary | ICD-10-CM | POA: Diagnosis not present

## 2022-04-30 DIAGNOSIS — D62 Acute posthemorrhagic anemia: Secondary | ICD-10-CM | POA: Diagnosis not present

## 2022-04-30 DIAGNOSIS — Y9301 Activity, walking, marching and hiking: Secondary | ICD-10-CM | POA: Diagnosis not present

## 2022-04-30 DIAGNOSIS — Z23 Encounter for immunization: Secondary | ICD-10-CM

## 2022-04-30 DIAGNOSIS — F17200 Nicotine dependence, unspecified, uncomplicated: Secondary | ICD-10-CM | POA: Diagnosis present

## 2022-04-30 DIAGNOSIS — S41112A Laceration without foreign body of left upper arm, initial encounter: Secondary | ICD-10-CM | POA: Diagnosis present

## 2022-04-30 DIAGNOSIS — W208XXA Other cause of strike by thrown, projected or falling object, initial encounter: Secondary | ICD-10-CM | POA: Diagnosis present

## 2022-04-30 DIAGNOSIS — S36031A Moderate laceration of spleen, initial encounter: Secondary | ICD-10-CM | POA: Diagnosis not present

## 2022-04-30 DIAGNOSIS — M25552 Pain in left hip: Secondary | ICD-10-CM | POA: Diagnosis not present

## 2022-04-30 DIAGNOSIS — Y92410 Unspecified street and highway as the place of occurrence of the external cause: Secondary | ICD-10-CM | POA: Diagnosis not present

## 2022-04-30 DIAGNOSIS — Z79899 Other long term (current) drug therapy: Secondary | ICD-10-CM | POA: Diagnosis not present

## 2022-04-30 DIAGNOSIS — S3600XA Unspecified injury of spleen, initial encounter: Secondary | ICD-10-CM | POA: Diagnosis not present

## 2022-04-30 DIAGNOSIS — T1490XA Injury, unspecified, initial encounter: Secondary | ICD-10-CM | POA: Diagnosis not present

## 2022-04-30 DIAGNOSIS — F17210 Nicotine dependence, cigarettes, uncomplicated: Secondary | ICD-10-CM

## 2022-04-30 DIAGNOSIS — F1210 Cannabis abuse, uncomplicated: Secondary | ICD-10-CM | POA: Diagnosis not present

## 2022-04-30 DIAGNOSIS — R0781 Pleurodynia: Secondary | ICD-10-CM | POA: Diagnosis not present

## 2022-04-30 HISTORY — DX: Other specified health status: Z78.9

## 2022-04-30 HISTORY — PX: SPLENECTOMY, TOTAL: SHX788

## 2022-04-30 LAB — CBC WITH DIFFERENTIAL/PLATELET
Abs Immature Granulocytes: 0.11 10*3/uL — ABNORMAL HIGH (ref 0.00–0.07)
Basophils Absolute: 0.1 10*3/uL (ref 0.0–0.1)
Basophils Relative: 0 %
Eosinophils Absolute: 0 10*3/uL (ref 0.0–0.5)
Eosinophils Relative: 0 %
HCT: 42.5 % (ref 39.0–52.0)
Hemoglobin: 14 g/dL (ref 13.0–17.0)
Immature Granulocytes: 1 %
Lymphocytes Relative: 10 %
Lymphs Abs: 1.9 10*3/uL (ref 0.7–4.0)
MCH: 28.9 pg (ref 26.0–34.0)
MCHC: 32.9 g/dL (ref 30.0–36.0)
MCV: 87.6 fL (ref 80.0–100.0)
Monocytes Absolute: 1 10*3/uL (ref 0.1–1.0)
Monocytes Relative: 5 %
Neutro Abs: 16.2 10*3/uL — ABNORMAL HIGH (ref 1.7–7.7)
Neutrophils Relative %: 84 %
Platelets: 265 10*3/uL (ref 150–400)
RBC: 4.85 MIL/uL (ref 4.22–5.81)
RDW: 12.6 % (ref 11.5–15.5)
WBC: 19.4 10*3/uL — ABNORMAL HIGH (ref 4.0–10.5)
nRBC: 0 % (ref 0.0–0.2)

## 2022-04-30 LAB — CBC
HCT: 41.7 % (ref 39.0–52.0)
HCT: 31.7 % — ABNORMAL LOW (ref 39.0–52.0)
Hemoglobin: 13.8 g/dL (ref 13.0–17.0)
Hemoglobin: 10.6 g/dL — ABNORMAL LOW (ref 13.0–17.0)
MCH: 29.2 pg (ref 26.0–34.0)
MCH: 29.1 pg (ref 26.0–34.0)
MCHC: 33.1 g/dL (ref 30.0–36.0)
MCHC: 33.4 g/dL (ref 30.0–36.0)
MCV: 88.3 fL (ref 80.0–100.0)
MCV: 87.1 fL (ref 80.0–100.0)
Platelets: 226 10*3/uL (ref 150–400)
Platelets: 207 10*3/uL (ref 150–400)
RBC: 4.72 MIL/uL (ref 4.22–5.81)
RBC: 3.64 MIL/uL — ABNORMAL LOW (ref 4.22–5.81)
RDW: 12.7 % (ref 11.5–15.5)
RDW: 12.7 % (ref 11.5–15.5)
WBC: 15.6 10*3/uL — ABNORMAL HIGH (ref 4.0–10.5)
WBC: 27.2 10*3/uL — ABNORMAL HIGH (ref 4.0–10.5)
nRBC: 0 % (ref 0.0–0.2)
nRBC: 0 % (ref 0.0–0.2)

## 2022-04-30 LAB — COMPREHENSIVE METABOLIC PANEL
ALT: 118 U/L — ABNORMAL HIGH (ref 0–44)
ALT: 118 U/L — ABNORMAL HIGH (ref 0–44)
AST: 96 U/L — ABNORMAL HIGH (ref 15–41)
AST: 87 U/L — ABNORMAL HIGH (ref 15–41)
Albumin: 4 g/dL (ref 3.5–5.0)
Albumin: 3.8 g/dL (ref 3.5–5.0)
Alkaline Phosphatase: 59 U/L (ref 38–126)
Alkaline Phosphatase: 66 U/L (ref 38–126)
Anion gap: 8 (ref 5–15)
Anion gap: 7 (ref 5–15)
BUN: 21 mg/dL — ABNORMAL HIGH (ref 6–20)
BUN: 18 mg/dL (ref 6–20)
CO2: 20 mmol/L — ABNORMAL LOW (ref 22–32)
CO2: 25 mmol/L (ref 22–32)
Calcium: 8.9 mg/dL (ref 8.9–10.3)
Calcium: 8.7 mg/dL — ABNORMAL LOW (ref 8.9–10.3)
Chloride: 104 mmol/L (ref 98–111)
Chloride: 100 mmol/L (ref 98–111)
Creatinine, Ser: 1.04 mg/dL (ref 0.61–1.24)
Creatinine, Ser: 1.05 mg/dL (ref 0.61–1.24)
GFR, Estimated: >60 mL/min (ref >60)
GFR, Estimated: >60 mL/min (ref >60)
Glucose, Bld: 185 mg/dL — ABNORMAL HIGH (ref 70–99)
Glucose, Bld: 162 mg/dL — ABNORMAL HIGH (ref 70–99)
Potassium: 4.6 mmol/L (ref 3.5–5.1)
Potassium: 6.9 mmol/L (ref 3.5–5.1)
Sodium: 132 mmol/L — ABNORMAL LOW (ref 135–145)
Sodium: 132 mmol/L — ABNORMAL LOW (ref 135–145)
Total Bilirubin: 1.3 mg/dL — ABNORMAL HIGH (ref 0.3–1.2)
Total Bilirubin: 1.7 mg/dL — ABNORMAL HIGH (ref 0.3–1.2)
Total Protein: 7.5 g/dL (ref 6.5–8.1)
Total Protein: 7 g/dL (ref 6.5–8.1)

## 2022-04-30 LAB — HIV ANTIBODY (ROUTINE TESTING W REFLEX): HIV Screen 4th Generation wRfx: NONREACTIVE

## 2022-04-30 LAB — PREPARE FRESH FROZEN PLASMA
Unit division: 0
Unit division: 0

## 2022-04-30 LAB — BPAM FFP
Blood Product Expiration Date: 202402282359
Blood Product Expiration Date: 202402282359
ISSUE DATE / TIME: 202402241016
ISSUE DATE / TIME: 202402241016
Unit Type and Rh: 6200
Unit Type and Rh: 6200

## 2022-04-30 LAB — PREPARE RBC (CROSSMATCH)

## 2022-04-30 LAB — POTASSIUM: Potassium: 4.5 mmol/L (ref 3.5–5.1)

## 2022-04-30 LAB — TROPONIN I (HIGH SENSITIVITY): Troponin I (High Sensitivity): 11 ng/L (ref <18)

## 2022-04-30 LAB — MRSA NEXT GEN BY PCR, NASAL: MRSA by PCR Next Gen: DETECTED — AB

## 2022-04-30 LAB — CREATININE, SERUM
Creatinine, Ser: 1.09 mg/dL (ref 0.61–1.24)
GFR, Estimated: >60 mL/min (ref >60)

## 2022-04-30 LAB — ABO/RH: ABO/RH(D): A POS

## 2022-04-30 LAB — LIPASE, BLOOD: Lipase: 33 U/L (ref 11–51)

## 2022-04-30 SURGERY — SPLENECTOMY
Anesthesia: General | Site: Abdomen

## 2022-04-30 MED ORDER — PHENYLEPHRINE HCL-NACL 20-0.9 MG/250ML-% IV SOLN
INTRAVENOUS | Status: DC | PRN
  Administered 2022-04-30: 15 ug/min via INTRAVENOUS

## 2022-04-30 MED ORDER — HYDROMORPHONE HCL 1 MG/ML IJ SOLN
1.0000 mg | Freq: Once | INTRAMUSCULAR | Status: AC
  Administered 2022-04-30: 1 mg via INTRAVENOUS
  Filled 2022-04-30: qty 1

## 2022-04-30 MED ORDER — MUPIROCIN 2 % EX OINT
1.0000 | TOPICAL_OINTMENT | Freq: Two times a day (BID) | CUTANEOUS | Status: DC
  Administered 2022-04-30 – 2022-05-02 (×5): 1 via NASAL
  Filled 2022-04-30 (×3): qty 22

## 2022-04-30 MED ORDER — SIMETHICONE 80 MG PO CHEW: 80.0000 mg | CHEWABLE_TABLET | Freq: Four times a day (QID) | ORAL | Status: DC | PRN

## 2022-04-30 MED ORDER — ONDANSETRON HCL 4 MG/2ML IJ SOLN
INTRAMUSCULAR | Status: AC
  Filled 2022-04-30: qty 6

## 2022-04-30 MED ORDER — ENOXAPARIN SODIUM 30 MG/0.3ML IJ SOSY
30.0000 mg | PREFILLED_SYRINGE | Freq: Two times a day (BID) | INTRAMUSCULAR | Status: DC
  Administered 2022-05-01 – 2022-05-02 (×3): 30 mg via SUBCUTANEOUS
  Filled 2022-04-30 (×3): qty 0.3

## 2022-04-30 MED ORDER — LACTATED RINGERS IV SOLN: INTRAVENOUS | Status: DC

## 2022-04-30 MED ORDER — ROCURONIUM BROMIDE 10 MG/ML (PF) SYRINGE
PREFILLED_SYRINGE | INTRAVENOUS | Status: DC | PRN
  Administered 2022-04-30: 40 mg via INTRAVENOUS
  Administered 2022-04-30 (×2): 10 mg via INTRAVENOUS

## 2022-04-30 MED ORDER — PROPOFOL 10 MG/ML IV BOLUS
INTRAVENOUS | Status: AC
  Filled 2022-04-30: qty 20

## 2022-04-30 MED ORDER — FENTANYL CITRATE (PF) 250 MCG/5ML IJ SOLN
INTRAMUSCULAR | Status: AC
  Filled 2022-04-30: qty 5

## 2022-04-30 MED ORDER — HYDROMORPHONE HCL 1 MG/ML IJ SOLN
1.0000 mg | INTRAMUSCULAR | Status: DC | PRN
  Administered 2022-04-30 – 2022-05-02 (×5): 1 mg via INTRAVENOUS
  Filled 2022-04-30 (×5): qty 1

## 2022-04-30 MED ORDER — PHENYLEPHRINE 80 MCG/ML (10ML) SYRINGE FOR IV PUSH (FOR BLOOD PRESSURE SUPPORT)
PREFILLED_SYRINGE | INTRAVENOUS | Status: AC
  Filled 2022-04-30: qty 20

## 2022-04-30 MED ORDER — ACETAMINOPHEN 325 MG PO TABS
650.0000 mg | ORAL_TABLET | Freq: Four times a day (QID) | ORAL | Status: DC
  Administered 2022-04-30 – 2022-05-02 (×6): 650 mg via ORAL
  Filled 2022-04-30 (×7): qty 2

## 2022-04-30 MED ORDER — METHOCARBAMOL 1000 MG/10ML IJ SOLN: 500.0000 mg | Freq: Four times a day (QID) | INTRAVENOUS | Status: DC | PRN

## 2022-04-30 MED ORDER — DEXMEDETOMIDINE HCL IN NACL 80 MCG/20ML IV SOLN
INTRAVENOUS | Status: DC | PRN
  Administered 2022-04-30: 20 ug via BUCCAL
  Administered 2022-04-30: 8 ug via BUCCAL

## 2022-04-30 MED ORDER — SODIUM CHLORIDE 0.9 % IV SOLN: INTRAVENOUS | Status: DC

## 2022-04-30 MED ORDER — ONDANSETRON HCL 4 MG/2ML IJ SOLN
INTRAMUSCULAR | Status: DC | PRN
  Administered 2022-04-30: 4 mg via INTRAVENOUS

## 2022-04-30 MED ORDER — BACITRACIN ZINC 500 UNIT/GM EX OINT
TOPICAL_OINTMENT | Freq: Once | CUTANEOUS | Status: AC
Start: 2022-04-30 — End: 2022-04-30
  Filled 2022-04-30: qty 0.9

## 2022-04-30 MED ORDER — DOCUSATE SODIUM 100 MG PO CAPS
100.0000 mg | ORAL_CAPSULE | Freq: Two times a day (BID) | ORAL | Status: DC
  Administered 2022-04-30 – 2022-05-02 (×4): 100 mg via ORAL
  Filled 2022-04-30 (×4): qty 1

## 2022-04-30 MED ORDER — PROCHLORPERAZINE EDISYLATE 10 MG/2ML IJ SOLN: 10.0000 mg | INTRAMUSCULAR | Status: DC | PRN

## 2022-04-30 MED ORDER — OXYCODONE HCL 5 MG PO TABS
10.0000 mg | ORAL_TABLET | ORAL | Status: DC | PRN
  Administered 2022-04-30 – 2022-05-02 (×8): 10 mg via ORAL
  Filled 2022-04-30 (×8): qty 2

## 2022-04-30 MED ORDER — PNEUMOCOCCAL 20-VAL CONJ VACC 0.5 ML IM SUSY
0.5000 mL | PREFILLED_SYRINGE | INTRAMUSCULAR | Status: AC | PRN
  Administered 2022-05-02: 0.5 mL via INTRAMUSCULAR
  Filled 2022-04-30: qty 0.5

## 2022-04-30 MED ORDER — ORAL CARE MOUTH RINSE: 15.0000 mL | Freq: Once | OROMUCOSAL | Status: AC

## 2022-04-30 MED ORDER — ONDANSETRON HCL 4 MG/2ML IJ SOLN
4.0000 mg | Freq: Once | INTRAMUSCULAR | Status: AC
  Administered 2022-04-30: 4 mg via INTRAVENOUS
  Filled 2022-04-30: qty 2

## 2022-04-30 MED ORDER — SODIUM CHLORIDE 0.9 % IV SOLN: 10.0000 mL/h | Freq: Once | INTRAVENOUS | Status: DC

## 2022-04-30 MED ORDER — LIDOCAINE 2% (20 MG/ML) 5 ML SYRINGE
INTRAMUSCULAR | Status: AC
  Filled 2022-04-30: qty 10

## 2022-04-30 MED ORDER — ACETAMINOPHEN 10 MG/ML IV SOLN
INTRAVENOUS | Status: AC
  Filled 2022-04-30: qty 100

## 2022-04-30 MED ORDER — CHLORHEXIDINE GLUCONATE 0.12 % MT SOLN
15.0000 mL | Freq: Once | OROMUCOSAL | Status: AC
  Administered 2022-04-30: 15 mL via OROMUCOSAL

## 2022-04-30 MED ORDER — PROPOFOL 1000 MG/100ML IV EMUL
INTRAVENOUS | Status: AC
  Filled 2022-04-30: qty 100

## 2022-04-30 MED ORDER — ONDANSETRON HCL 4 MG/2ML IJ SOLN: 4.0000 mg | Freq: Four times a day (QID) | INTRAMUSCULAR | Status: DC | PRN

## 2022-04-30 MED ORDER — DEXAMETHASONE SODIUM PHOSPHATE 10 MG/ML IJ SOLN
INTRAMUSCULAR | Status: AC
  Filled 2022-04-30: qty 3

## 2022-04-30 MED ORDER — PROPOFOL 10 MG/ML IV BOLUS
INTRAVENOUS | Status: DC | PRN
  Administered 2022-04-30: 20 mg via INTRAVENOUS
  Administered 2022-04-30: 150 mg via INTRAVENOUS

## 2022-04-30 MED ORDER — LIDOCAINE 2% (20 MG/ML) 5 ML SYRINGE
INTRAMUSCULAR | Status: DC | PRN
  Administered 2022-04-30: 100 mg via INTRAVENOUS

## 2022-04-30 MED ORDER — FENTANYL CITRATE (PF) 250 MCG/5ML IJ SOLN
INTRAMUSCULAR | Status: DC | PRN
  Administered 2022-04-30 (×6): 25 ug via INTRAVENOUS
  Administered 2022-04-30: 100 ug via INTRAVENOUS
  Administered 2022-04-30 (×3): 50 ug via INTRAVENOUS

## 2022-04-30 MED ORDER — CEFAZOLIN SODIUM 1 G IJ SOLR
INTRAMUSCULAR | Status: AC
  Filled 2022-04-30: qty 20

## 2022-04-30 MED ORDER — MENINGOCOCCAL VAC B (OMV) IM SUSY
0.5000 mL | PREFILLED_SYRINGE | INTRAMUSCULAR | Status: AC | PRN
  Administered 2022-05-02: 0.5 mL via INTRAMUSCULAR
  Filled 2022-04-30: qty 0.5

## 2022-04-30 MED ORDER — TETANUS-DIPHTH-ACELL PERTUSSIS 5-2.5-18.5 LF-MCG/0.5 IM SUSY
0.5000 mL | PREFILLED_SYRINGE | Freq: Once | INTRAMUSCULAR | Status: AC
  Administered 2022-04-30: 0.5 mL via INTRAMUSCULAR
  Filled 2022-04-30: qty 0.5

## 2022-04-30 MED ORDER — ROCURONIUM BROMIDE 10 MG/ML (PF) SYRINGE
PREFILLED_SYRINGE | INTRAVENOUS | Status: AC
  Filled 2022-04-30: qty 30

## 2022-04-30 MED ORDER — LIDOCAINE HCL (PF) 1 % IJ SOLN
5.0000 mL | Freq: Once | INTRAMUSCULAR | Status: AC
  Administered 2022-04-30: 5 mL via INTRADERMAL
  Filled 2022-04-30: qty 5

## 2022-04-30 MED ORDER — 0.9 % SODIUM CHLORIDE (POUR BTL) OPTIME
TOPICAL | Status: DC | PRN
  Administered 2022-04-30: 2000 mL
  Administered 2022-04-30: 1000 mL

## 2022-04-30 MED ORDER — SUGAMMADEX SODIUM 200 MG/2ML IV SOLN
INTRAVENOUS | Status: DC | PRN
  Administered 2022-04-30: 200 mg via INTRAVENOUS

## 2022-04-30 MED ORDER — ACETAMINOPHEN 10 MG/ML IV SOLN
INTRAVENOUS | Status: DC | PRN
  Administered 2022-04-30: 1000 mg via INTRAVENOUS

## 2022-04-30 MED ORDER — HAEMOPHILUS B POLYSAC CONJ VAC IM SOLR
0.5000 mL | INTRAMUSCULAR | Status: AC | PRN
  Administered 2022-05-02: 0.5 mL via INTRAMUSCULAR
  Filled 2022-04-30: qty 0.5

## 2022-04-30 MED ORDER — SODIUM CHLORIDE 0.9 % IV BOLUS (SEPSIS)
1000.0000 mL | Freq: Once | INTRAVENOUS | Status: AC
  Administered 2022-04-30: 1000 mL via INTRAVENOUS

## 2022-04-30 MED ORDER — ORAL CARE MOUTH RINSE: 15.0000 mL | OROMUCOSAL | Status: DC | PRN

## 2022-04-30 MED ORDER — DEXMEDETOMIDINE HCL IN NACL 80 MCG/20ML IV SOLN
INTRAVENOUS | Status: AC
  Filled 2022-04-30: qty 20

## 2022-04-30 MED ORDER — CEFAZOLIN SODIUM-DEXTROSE 2-4 GM/100ML-% IV SOLN
2.0000 g | Freq: Three times a day (TID) | INTRAVENOUS | Status: DC
  Administered 2022-04-30 – 2022-05-02 (×8): 2 g via INTRAVENOUS
  Filled 2022-04-30 (×8): qty 100

## 2022-04-30 MED ORDER — MENINGOCOCCAL A C Y&W-135 OLIG IM SOLR
0.5000 mL | INTRAMUSCULAR | Status: AC | PRN
  Administered 2022-05-02: 0.5 mL via INTRAMUSCULAR
  Filled 2022-04-30: qty 0.5

## 2022-04-30 MED ORDER — MIDAZOLAM HCL 2 MG/2ML IJ SOLN
INTRAMUSCULAR | Status: DC | PRN
  Administered 2022-04-30: 2 mg via INTRAVENOUS

## 2022-04-30 MED ORDER — DEXAMETHASONE SODIUM PHOSPHATE 10 MG/ML IJ SOLN
INTRAMUSCULAR | Status: DC | PRN
  Administered 2022-04-30: 4 mg via INTRAVENOUS

## 2022-04-30 MED ORDER — CHLORHEXIDINE GLUCONATE CLOTH 2 % EX PADS
6.0000 | MEDICATED_PAD | Freq: Every day | CUTANEOUS | Status: DC
  Administered 2022-04-30 – 2022-05-01 (×2): 6 via TOPICAL

## 2022-04-30 MED ORDER — CHLORHEXIDINE GLUCONATE 0.12 % MT SOLN
OROMUCOSAL | Status: AC
  Filled 2022-04-30: qty 15

## 2022-04-30 MED ORDER — PROMETHAZINE HCL 25 MG/ML IJ SOLN: 6.2500 mg | INTRAMUSCULAR | Status: DC | PRN

## 2022-04-30 MED ORDER — MIDAZOLAM HCL 2 MG/2ML IJ SOLN
INTRAMUSCULAR | Status: AC
  Filled 2022-04-30: qty 2

## 2022-04-30 MED ORDER — FENTANYL CITRATE (PF) 100 MCG/2ML IJ SOLN: 25.0000 ug | INTRAMUSCULAR | Status: DC | PRN

## 2022-04-30 MED ORDER — IOHEXOL 300 MG/ML  SOLN
100.0000 mL | Freq: Once | INTRAMUSCULAR | Status: AC | PRN
Start: 2022-04-30 — End: 2022-04-30
  Administered 2022-04-30: 100 mL via INTRAVENOUS

## 2022-04-30 MED ORDER — SUCCINYLCHOLINE CHLORIDE 200 MG/10ML IV SOSY
PREFILLED_SYRINGE | INTRAVENOUS | Status: DC | PRN
  Administered 2022-04-30: 120 mg via INTRAVENOUS

## 2022-04-30 MED ORDER — SUCCINYLCHOLINE CHLORIDE 200 MG/10ML IV SOSY
PREFILLED_SYRINGE | INTRAVENOUS | Status: AC
  Filled 2022-04-30: qty 20

## 2022-04-30 MED ORDER — OXYCODONE HCL 5 MG PO TABS: 5.0000 mg | ORAL_TABLET | ORAL | Status: DC | PRN

## 2022-04-30 MED ORDER — METOPROLOL TARTRATE 5 MG/5ML IV SOLN
INTRAVENOUS | Status: AC
  Filled 2022-04-30: qty 5

## 2022-04-30 SURGICAL SUPPLY — 54 items
APPLIER CLIP 11 MED OPEN (CLIP) ×1
APPLIER CLIP 13 LRG OPEN (CLIP)
BAG COUNTER SPONGE SURGICOUNT (BAG) ×1 IMPLANT
BLADE CLIPPER SURG (BLADE) IMPLANT
CANISTER SUCT 3000ML PPV (MISCELLANEOUS) ×2 IMPLANT
CHLORAPREP W/TINT 26 (MISCELLANEOUS) ×1 IMPLANT
CLIP APPLIE 11 MED OPEN (CLIP) ×1 IMPLANT
CLIP APPLIE 13 LRG OPEN (CLIP) IMPLANT
COVER SURGICAL LIGHT HANDLE (MISCELLANEOUS) ×1 IMPLANT
DRAIN CHANNEL 19F RND (DRAIN) IMPLANT
DRAPE LAPAROSCOPIC ABDOMINAL (DRAPES) ×1 IMPLANT
DRAPE WARM FLUID 44X44 (DRAPES) IMPLANT
DRSG OPSITE POSTOP 4X12 (GAUZE/BANDAGES/DRESSINGS) IMPLANT
ELECT BLADE 6.5 EXT (BLADE) ×1 IMPLANT
ELECT REM PT RETURN 9FT ADLT (ELECTROSURGICAL) ×1
ELECTRODE REM PT RTRN 9FT ADLT (ELECTROSURGICAL) ×1 IMPLANT
EVACUATOR SILICONE 100CC (DRAIN) IMPLANT
GLOVE BIO SURGEON STRL SZ8 (GLOVE) ×1 IMPLANT
GLOVE BIOGEL PI IND STRL 8 (GLOVE) ×1 IMPLANT
GOWN STRL REUS W/ TWL LRG LVL3 (GOWN DISPOSABLE) ×2 IMPLANT
GOWN STRL REUS W/ TWL XL LVL3 (GOWN DISPOSABLE) ×1 IMPLANT
GOWN STRL REUS W/TWL LRG LVL3 (GOWN DISPOSABLE) ×2
GOWN STRL REUS W/TWL XL LVL3 (GOWN DISPOSABLE) ×1
HANDLE SUCTION POOLE (INSTRUMENTS) IMPLANT
HEMOSTAT SURGICEL 2X14 (HEMOSTASIS) IMPLANT
KIT BASIN OR (CUSTOM PROCEDURE TRAY) ×1 IMPLANT
KIT TURNOVER KIT B (KITS) ×1 IMPLANT
LIGASURE IMPACT 36 18CM CVD LR (INSTRUMENTS) IMPLANT
NS IRRIG 1000ML POUR BTL (IV SOLUTION) ×2 IMPLANT
PACK GENERAL/GYN (CUSTOM PROCEDURE TRAY) ×1 IMPLANT
PAD ARMBOARD 7.5X6 YLW CONV (MISCELLANEOUS) ×2 IMPLANT
PENCIL SMOKE EVACUATOR (MISCELLANEOUS) ×1 IMPLANT
RELOAD STAPLE 60 2.6 WHT THN (STAPLE) IMPLANT
RELOAD STAPLER WHITE 60MM (STAPLE) ×2 IMPLANT
SPECIMEN JAR X LARGE (MISCELLANEOUS) ×1 IMPLANT
SPIKE FLUID TRANSFER (MISCELLANEOUS) IMPLANT
SPONGE DRAIN TRACH 4X4 STRL 2S (GAUZE/BANDAGES/DRESSINGS) IMPLANT
SPONGE INTESTINAL PEANUT (DISPOSABLE) IMPLANT
SPONGE SURGIFOAM ABS GEL 100 (HEMOSTASIS) IMPLANT
SPONGE T-LAP 18X18 ~~LOC~~+RFID (SPONGE) IMPLANT
STAPLE ECHEON FLEX 60 POW ENDO (STAPLE) IMPLANT
STAPLER RELOAD WHITE 60MM (STAPLE) ×2
STAPLER VISISTAT 35W (STAPLE) ×1 IMPLANT
SUCTION POOLE HANDLE (INSTRUMENTS) ×1
SUT ETHILON 2 0 FS 18 (SUTURE) IMPLANT
SUT PDS AB 1 TP1 54 (SUTURE) IMPLANT
SUT PDS AB 1 TP1 96 (SUTURE) ×2 IMPLANT
SUT SILK 0 TIES 10X30 (SUTURE) ×1 IMPLANT
SUT SILK 2 0 SH (SUTURE) ×2 IMPLANT
SUT SILK 2 0 TIES 10X30 (SUTURE) ×1 IMPLANT
SUT SILK 2 0SH CR/8 30 (SUTURE) ×1 IMPLANT
TOWEL GREEN STERILE (TOWEL DISPOSABLE) ×1 IMPLANT
TOWEL GREEN STERILE FF (TOWEL DISPOSABLE) ×1 IMPLANT
TRAY FOLEY MTR SLVR 14FR STAT (SET/KITS/TRAYS/PACK) IMPLANT

## 2022-04-30 NOTE — Transfer of Care (Signed)
Immediate Anesthesia Transfer of Care Note  Patient: Ross Reed  Procedure(s) Performed: SPLENECTOMY (Abdomen)  Patient Location: PACU  Anesthesia Type:General  Level of Consciousness: awake and alert   Airway & Oxygen Therapy: Patient Spontanous Breathing and Patient connected to nasal cannula oxygen  Post-op Assessment: Report given to RN and Post -op Vital signs reviewed and stable  Post vital signs: Reviewed and stable  Last Vitals:  Vitals Value Taken Time  BP 111/76 04/30/22 1204  Temp    Pulse 89 04/30/22 1206  Resp 18 04/30/22 1206  SpO2 91 % 04/30/22 1206  Vitals shown include unvalidated device data.  Last Pain:  Vitals:   04/30/22 0913  TempSrc: Oral  PainSc:          Complications: No notable events documented.
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
## 2022-04-30 NOTE — Progress Notes (Signed)
Patient for OR. Patient A&O X4, consent obtained at the bedside with Dr. Thermon Leyland.  Patient kept NPO, preop checklist completed. Report given to Preop RN. CHG wipes done. Ancef currently infusing as ordered. +MRSA, Bactroban applied to nares. Patient with critical K+ 6.9, previous K+ WNL. MD aware, STAT repeat ordered.
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
## 2022-04-30 NOTE — Progress Notes (Signed)
Received pt from [facility],  alert and oriented, On call made aware of pt's arrival
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
## 2022-04-30 NOTE — Anesthesia Procedure Notes (Signed)
Procedure Name: Intubation Date/Time: 04/30/2022 10:17 AM  Performed by: Wilburn Cornelia, CRNAPre-anesthesia Checklist: Patient identified, Emergency Drugs available, Suction available, Patient being monitored and Timeout performed Patient Re-evaluated:Patient Re-evaluated prior to induction Oxygen Delivery Method: Circle system utilized Preoxygenation: Pre-oxygenation with 100% oxygen Induction Type: IV induction Ventilation: Mask ventilation without difficulty Grade View: Grade II Tube type: Oral Tube size: 7.5 mm Number of attempts: 1 Airway Equipment and Method: Video-laryngoscopy and Rigid stylet Placement Confirmation: breath sounds checked- equal and bilateral, CO2 detector, positive ETCO2 and ETT inserted through vocal cords under direct vision Secured at: 23 cm Tube secured with: Tape Dental Injury: Teeth and Oropharynx as per pre-operative assessment

## 2022-04-30 NOTE — Anesthesia Postprocedure Evaluation (Signed)
Anesthesia Post Note  Patient: Ross Reed  Procedure(s) Performed: SPLENECTOMY (Abdomen)     Patient location during evaluation: PACU Anesthesia Type: General Level of consciousness: awake and alert Pain management: pain level controlled Vital Signs Assessment: post-procedure vital signs reviewed and stable Respiratory status: spontaneous breathing, nonlabored ventilation, respiratory function stable and patient connected to nasal cannula oxygen Cardiovascular status: blood pressure returned to baseline and stable Postop Assessment: no apparent nausea or vomiting Anesthetic complications: no   No notable events documented.  Last Vitals:  Vitals:   04/30/22 1500 04/30/22 1600  BP: (!) 147/73 (!) 145/83  Pulse: 87 90  Resp: 17 16  Temp:    SpO2: 99% 98%    Last Pain:  Vitals:   04/30/22 1518  TempSrc:   PainSc: [place]

## 2022-04-30 NOTE — Anesthesia Preprocedure Evaluation (Addendum)
Anesthesia Evaluation  Patient identified by MRN, date of birth, ID band Patient awake    Reviewed: Allergy & Precautions, NPO status , Patient's Chart, lab work & pertinent test results  Airway Mallampati: III  TM Distance: <3 FB Neck ROM: Full    Dental  (+) Dental Advisory Given, Missing, Poor Dentition   Pulmonary Current Smoker and Patient abstained from smoking.   Pulmonary exam normal breath sounds clear to auscultation       Cardiovascular negative cardio ROS Normal cardiovascular exam Rhythm:Regular Rate:Normal     Neuro/Psych negative neurological ROS     GI/Hepatic ,,,(+)     substance abuse  alcohol use and marijuana useSpleen injury   Endo/Other  negative endocrine ROS    Renal/GU negative Renal ROS     Musculoskeletal negative musculoskeletal ROS (+)    Abdominal   Peds  Hematology negative hematology ROS (+)   Anesthesia Other Findings   Reproductive/Obstetrics                             Anesthesia Physical Anesthesia Plan  ASA: 4  Anesthesia Plan: General   Post-op Pain Management: Ofirmev IV (intra-op)*   Induction: Intravenous  PONV Risk Score and Plan: 2 and Midazolam, Dexamethasone and Ondansetron  Airway Management Planned: Oral ETT and Video Laryngoscope Planned  Additional Equipment: Arterial line  Intra-op Plan:   Post-operative Plan: Possible Post-op intubation/ventilation  Informed Consent: I have reviewed the patients History and Physical, chart, labs and discussed the procedure including the risks, benefits and alternatives for the proposed anesthesia with the patient or authorized representative who has indicated his/her understanding and acceptance.     Dental advisory given  Plan Discussed with: CRNA  Anesthesia Plan Comments:         Anesthesia Quick Evaluation

## 2022-05-01 ENCOUNTER — Encounter (HOSPITAL_COMMUNITY): Payer: Self-pay | Admitting: Surgery

## 2022-05-01 LAB — BASIC METABOLIC PANEL
Anion gap: 14 (ref 5–15)
BUN: 13 mg/dL (ref 6–20)
CO2: 23 mmol/L (ref 22–32)
Calcium: 8.4 mg/dL — ABNORMAL LOW (ref 8.9–10.3)
Chloride: 96 mmol/L — ABNORMAL LOW (ref 98–111)
Creatinine, Ser: 1.12 mg/dL (ref 0.61–1.24)
GFR, Estimated: >60 mL/min (ref >60)
Glucose, Bld: 139 mg/dL — ABNORMAL HIGH (ref 70–99)
Potassium: 4.5 mmol/L (ref 3.5–5.1)
Sodium: 133 mmol/L — ABNORMAL LOW (ref 135–145)

## 2022-05-01 LAB — CBC
HCT: 27.4 % — ABNORMAL LOW (ref 39.0–52.0)
Hemoglobin: 9.5 g/dL — ABNORMAL LOW (ref 13.0–17.0)
MCH: 30.3 pg (ref 26.0–34.0)
MCHC: 34.7 g/dL (ref 30.0–36.0)
MCV: 87.3 fL (ref 80.0–100.0)
Platelets: 197 10*3/uL (ref 150–400)
RBC: 3.14 MIL/uL — ABNORMAL LOW (ref 4.22–5.81)
RDW: 12.8 % (ref 11.5–15.5)
WBC: 22.5 10*3/uL — ABNORMAL HIGH (ref 4.0–10.5)
nRBC: 0 % (ref 0.0–0.2)
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
## 2022-05-01 NOTE — Progress Notes (Signed)
Foley removed at 0530, awaiting pt to void

## 2022-05-01 NOTE — Progress Notes (Signed)
Patient transferred to 6N via wc without incident. Report given to [facility] RN. All belongings transferred including: clothes, hat, wallet, $20 in cash, ipad, and charger.

## 2022-05-02 ENCOUNTER — Other Ambulatory Visit (HOSPITAL_COMMUNITY): Payer: Self-pay

## 2022-05-02 LAB — POCT I-STAT, CHEM 8
BUN: 21 mg/dL — ABNORMAL HIGH (ref 6–20)
Calcium, Ion: 1.2 mmol/L (ref 1.15–1.40)
Chloride: 100 mmol/L (ref 98–111)
Creatinine, Ser: 0.8 mg/dL (ref 0.61–1.24)
Glucose, Bld: 143 mg/dL — ABNORMAL HIGH (ref 70–99)
HCT: 36 % — ABNORMAL LOW (ref 39.0–52.0)
Hemoglobin: 12.2 g/dL — ABNORMAL LOW (ref 13.0–17.0)
Potassium: 4.7 mmol/L (ref 3.5–5.1)
Sodium: 136 mmol/L (ref 135–145)
TCO2: 25 mmol/L (ref 22–32)

## 2022-05-02 LAB — CBC
HCT: 25.7 % — ABNORMAL LOW (ref 39.0–52.0)
Hemoglobin: 8.4 g/dL — ABNORMAL LOW (ref 13.0–17.0)
MCH: 29.3 pg (ref 26.0–34.0)
MCHC: 32.7 g/dL (ref 30.0–36.0)
MCV: 89.5 fL (ref 80.0–100.0)
Platelets: 224 10*3/uL (ref 150–400)
RBC: 2.87 MIL/uL — ABNORMAL LOW (ref 4.22–5.81)
RDW: 12.7 % (ref 11.5–15.5)
WBC: 21.5 10*3/uL — ABNORMAL HIGH (ref 4.0–10.5)
nRBC: 0.1 % (ref 0.0–0.2)

## 2022-05-02 LAB — BASIC METABOLIC PANEL
Anion gap: 6 (ref 5–15)
BUN: 14 mg/dL (ref 6–20)
CO2: 28 mmol/L (ref 22–32)
Calcium: 8 mg/dL — ABNORMAL LOW (ref 8.9–10.3)
Chloride: 95 mmol/L — ABNORMAL LOW (ref 98–111)
Creatinine, Ser: 0.89 mg/dL (ref 0.61–1.24)
GFR, Estimated: >60 mL/min (ref >60)
Glucose, Bld: 119 mg/dL — ABNORMAL HIGH (ref 70–99)
Potassium: 3.8 mmol/L (ref 3.5–5.1)
Sodium: 129 mmol/L — ABNORMAL LOW (ref 135–145)

## 2022-05-02 MED ORDER — OXYCODONE HCL 5 MG PO TABS
5.0000 mg | ORAL_TABLET | Freq: Four times a day (QID) | ORAL | 0 refills | Status: AC | PRN
  Filled 2022-05-02: qty 28, 7d supply, fill #0

## 2022-05-02 MED ORDER — LORAZEPAM 2 MG/ML IJ SOLN: 1.0000 mg | INTRAMUSCULAR | Status: DC | PRN

## 2022-05-02 MED ORDER — THIAMINE MONONITRATE 100 MG PO TABS
100.0000 mg | ORAL_TABLET | Freq: Every day | ORAL | Status: DC
  Administered 2022-05-02: 100 mg via ORAL
  Filled 2022-05-02: qty 1

## 2022-05-02 MED ORDER — ACETAMINOPHEN 500 MG PO TABS: 1000.0000 mg | ORAL_TABLET | Freq: Four times a day (QID) | ORAL | Status: AC | PRN

## 2022-05-02 MED ORDER — METHOCARBAMOL 500 MG PO TABS
500.0000 mg | ORAL_TABLET | Freq: Four times a day (QID) | ORAL | 0 refills | Status: AC | PRN
  Filled 2022-05-02: qty 28, 7d supply, fill #0

## 2022-05-02 MED ORDER — SODIUM CHLORIDE 1 G PO TABS
1.0000 g | ORAL_TABLET | Freq: Two times a day (BID) | ORAL | Status: DC
  Administered 2022-05-02: 1 g via ORAL
  Filled 2022-05-02 (×3): qty 1

## 2022-05-02 MED ORDER — THIAMINE HCL 100 MG/ML IJ SOLN: 100.0000 mg | Freq: Every day | INTRAMUSCULAR | Status: DC

## 2022-05-02 MED ORDER — ADULT MULTIVITAMIN W/MINERALS CH
1.0000 | ORAL_TABLET | Freq: Every day | ORAL | Status: DC
  Administered 2022-05-02: 1 via ORAL
  Filled 2022-05-02: qty 1

## 2022-05-02 MED ORDER — SODIUM CHLORIDE 1 G PO TABS
1.0000 g | ORAL_TABLET | Freq: Two times a day (BID) | ORAL | 0 refills | Status: AC
  Filled 2022-05-02: qty 10, 5d supply, fill #0

## 2022-05-02 MED ORDER — LORAZEPAM 1 MG PO TABS: 1.0000 mg | ORAL_TABLET | ORAL | Status: DC | PRN

## 2022-05-02 MED ORDER — DOCUSATE SODIUM 100 MG PO CAPS: 100.0000 mg | ORAL_CAPSULE | Freq: Two times a day (BID) | ORAL | Status: AC | PRN

## 2022-05-02 MED ORDER — ACETAMINOPHEN 500 MG PO TABS
1000.0000 mg | ORAL_TABLET | Freq: Four times a day (QID) | ORAL | Status: DC
  Administered 2022-05-02 (×2): 1000 mg via ORAL
  Filled 2022-05-02 (×2): qty 2

## 2022-05-02 MED ORDER — FOLIC ACID 1 MG PO TABS
1.0000 mg | ORAL_TABLET | Freq: Every day | ORAL | Status: DC
  Administered 2022-05-02: 1 mg via ORAL
  Filled 2022-05-02: qty 1

## 2022-05-02 NOTE — Discharge Instructions (Signed)
MIDLINE WOUND CARE: - midline dressing to be changed daily - supplies: normal saline, kerlix/gauze, scissors, ABD pads, tape  - remove dressing and all packing carefully, moistening with sterile saline as needed to avoid packing/internal dressing sticking to the wound. - clean edges of skin around the wound with water/gauze, making sure there is no tape debris or leakage left on skin that could cause skin irritation or breakdown. - dampen a clean kerlix/gauze with saline and pack wound from wound base to skin level,  Wound can be packed loosely. Trim kerlix to size if a whole kerlix is not required. - cover wound with a dry ABD pad and secure with tape.  - apply any skin protectant/powder recommended by clinician to protect skin/skin folds. - change dressing as needed if leakage occurs, wound gets contaminated, or patient requests to shower. - patient may shower daily with wound open and following the shower the wound should be dried and a clean dressing placed.     CIRUGA: INSTRUCCIONES POSTOP (Ciruga de obstruccin del intestino delgado, reseccin de colon, etc.)   ############################################## ###################  COME Transicin gradual a una dieta alta en fibra con un suplemento de fibra durante los prximos das despus del alta  CAMINAR Camina una hora al da. Controla tu dolor para hacer eso.  CONTROLAR EL DOLOR Controle el dolor para que pueda caminar, dormir, tolerar estornudos/tos, subir/bajar escaleras.  TENGA UNA DEPOSICIN DIARIAMENTE Mantenga sus evacuaciones regulares para evitar problemas. Est bien probar un laxante para anular el estreimiento. Est bien usar un antidiarreico para retrasar la diarrea. Llame si no mejora despus de 2 intentos  LLAME SI TIENE PROBLEMAS/INQUIETUDES Llame si todava tiene problemas a pesar de seguir estas instrucciones. Llame si tiene inquietudes que no han sido respondidas por estas  instrucciones  ############################################## ###################   DIETA Seguir una dieta ligera los primeros das en casa. Comience con una dieta blanda como sopas, lquidos, alimentos ricos en almidn, alimentos bajos en grasa, etc. Si se siente lleno, hinchado o estreido, siga una dieta lquida completa o en pur/licuada durante unos das hasta que se sienta mejor y no ms tiempo estreido. Asegrese de beber muchos lquidos todos los das para evitar deshidratarse (sentirse mareado, no orinar, etc.). Agregue gradualmente un suplemento de fibra a su dieta durante la prxima semana. Vuelva gradualmente a una dieta slida regular. Evite la comida rpida o las comidas pesadas la primera semana, ya que es ms probable que tenga nuseas. Se espera que su tracto digestivo necesite algunos meses para volver a la normalidad. Es comn que sus evacuaciones intestinales y heces sean irregulares. Tendr hinchazn y calambres ocasionales que eventualmente desaparecern. Hasta que est comiendo alimentos slidos normalmente, deje de tomar todos los medicamentos para el dolor y regrese a sus actividades regulares; sus intestinos no sern normales. Concntrese en llevar una dieta baja en grasas y alta en fibra por el resto de su vida (consulte Cmo lograr una buena salud intestinal, a continuacin).  CUIDADO de su INCISIN o HERIDA Es bueno para incisiones cerradas e incluso heridas abiertas para lavarse todos los das. Dchate todos los das. Los baos cortos estn bien. Lave las incisiones y heridas con agua y jabn.  Si tiene una(s) incisin(es) cerrada(s), lvela con agua y jabn todos los das. Puede dejar las incisiones cerradas abiertas al aire si est seco. Puede cubrir la incisin con una gasa limpia y volver a colocarla despus de su ducha diaria para mayor comodidad.  Es bueno lavar las incisiones cerradas e incluso las   heridas abiertas todos los das. Dchate todos los das. Los  baos cortos estn bien. Lave las incisiones y heridas con agua y jabn. Puede dejar las incisiones cerradas abiertas al aire si est seco. Puede cubrir la incisin con una gasa limpia y volver a colocarla despus de su ducha diaria para mayor comodidad.  Si tiene una herida abierta con una aspiradora para heridas, consulte las instrucciones de cuidado de la aspiradora para heridas.   ACTIVIDADES segn tolerancia Inicie actividades diarias livianas (cuidado personal, caminar, subir escaleras) a partir del da posterior a la ciruga. Aumente gradualmente las actividades segn lo tolere. Controla tu dolor para estar activo. Detngase cuando est cansado. Lo ideal es caminar varias veces al da, eventualmente una hora al da. La mayora de las personas regresan a la mayora de las actividades cotidianas en unas pocas semanas. Se necesitan de 4 a 8 semanas para volver a la actividad intensa y sin restricciones. Si puede caminar 30 minutos sin dificultad, es seguro intentar una actividad ms intensa como trotar, caminar en una caminadora, andar en bicicleta, ejercicios aerbicos de bajo impacto, nadar, etc. Guarde la actividad ms intensa y extenuante para el final (por lo general, de 4 a 8 semanas despus de la ciruga), como abdominales, levantar objetos pesados, deportes de contacto, etc. Abstngase de cualquier trabajo pesado intenso o esfuerzo hasta que deje de tomar narcticos para controlar el dolor. Tendr das libres, pero las cosas deberan mejorar semana a semana. NO EMPUJE A TRAVS DEL DOLOR. Deja que el dolor sea tu gua: si te duele hacer algo, no lo hagas. El dolor es tu cuerpo advirtindote que evites esa actividad durante otra semana hasta que el dolorbaja. Puede conducir cuando ya no est tomando medicamentos narcticos recetados para el dolor, puede usar cmodamente el cinturn de seguridad y puede hacer giros/paradas repentinos de manera segura para protegerse sin vacilar debido al  dolor. Puede tener relaciones sexuales cuando le resulte cmodo. Si te duele hacer algo, detente.  MEDICAMENTOS Tome los medicamentos que le recetan normalmente en casa, a menos que le indiquen lo contrario. Anticoagulantes: Por lo general, puede reiniciar cualquier anticoagulante fuerte despus del segundo da posoperatorio. Est bien tomar una aspirina de inmediato.   Si est tomando anticoagulantes fuertes (warfarina/Coumadin, Plavix, Xerelto, Eliquis, Pradaxa, etc.), hable con su cirujano, mdico de atencin primaria y/o cardilogo para obtener instrucciones sobre cundo reiniciar el anticoagulante y si es necesario controlar la sangre. (anlisis de sangre PT/INR, etc.).  CONTROL DE DOLOR El dolor despus de la ciruga o relacionado con la actividad a menudo se debe a una tensin/lesin en el msculo, tendn, nervios y/o incisiones. Este dolor suele ser a corto plazo y mejorar en unos pocos meses. Para ayudar a acelerar el proceso de curacin y volver a la actividad normal ms rpidamente, HAGAN LAS SIGUIENTES COSAS JUNTOS: 1. Aumente la actividad gradualmente. NO EMPUJE A TRAVS DEL DOLOR 2. Usa Hielo y/o Calor 3. Prueba masajes suaves y/o estiramientos 4. Tome analgsicos de venta libre 5. Tome analgsicos recetados con narcticos para el dolor ms intenso  Buen control del dolor = recuperacin ms rpida. Es mejor tomar ms medicamentos para estar ms activo que quedarse en cama todo el da para evitar los medicamentos. 1. Incrementa la actividad gradualmente Evite levantar objetos pesados al principio, luego aumente a levantar segn lo tolere durante las prximas 6 semanas. No "empuje a travs" del dolor. Escucha a tu cuerpo y evita posiciones y maniobras que reproduzcan el dolor. Espera unos das antes de probar   algo ms intenso. Se recomienda caminar una hora al da para ayudar a que su cuerpo se recupere ms rpido y de manera ms segura. Comience lentamente y detngase cuando  sienta dolor. Si puedes caminar 30 minutos sin parar ni sentir dolor, puedes probar con actividades ms intensas (correr, trotar, aerbicos, andar en bicicleta, nadar, caminadora, sexo, deportes, levantamiento de pesas, etc.) Recuerda: si te duele hacerlo, no lo hagas! 2. Usa Hielo y/o Calor Tendr hinchazn y moretones alrededor de las incisiones. Esto tardar varias semanas en resolverse. Bolsas de hielo o almohadillas trmicas (6 a 8 veces al da, 30 a 60 minutos cada vez) ayudarn a aliviar el dolor y los moretones. Algunas personas prefieren usar hielo solo, calor solo o alternar entre hielo y calor. Experimente y vea qu funciona mejor para usted. Considere probar el hielo durante los primeros das para ayudar a disminuir la hinchazn y los moretones; luego, cambie a calor para ayudar a relajar los puntos doloridos y acelerar la recuperacin. Dchate todos los das. Los baos cortos estn bien. Se siente bien! Mantenga las incisiones y heridas limpias con agua y jabn. 3. Prueba masajes suaves y/o estiramientos Masaje en el rea del dolor muchas veces al da Detente si sientes dolor, no te excedas. 4. Tome analgsicos de venta libre Esto ayuda a que los tejidos musculares y nerviosos se vuelvan menos irritables y se calmen ms rpido. Elija UNO de los siguientes medicamentos antiinflamatorios de venta libre: Pastillas de 500 mg de acetaminofn (Tylenol) 1 o 2 pastillas con cada comida y justo antes de acostarse (evtelo si tiene problemas hepticos o si tiene acetaminofn en su receta de narcticos) Tabletas de naproxeno de 220 mg (p. ej., Aleve, Naprosyn) 1 o 2 tabletas dos veces al da (evtela si tiene problemas de rin, estmago, EII o sangrado) Pastillas de 200 mg de ibuprofeno (p. ej., Advil, Motrin) 3 o 4 pastillas con cada comida y justo antes de acostarse (evtelo si tiene problemas de rin, estmago, EII o sangrado) Tmelo con comida/refrigerio varias veces al da segn las  indicaciones durante al menos 2 semanas para ayudar a mantener el dolor/dolor bajo y ms manejable. 5. Tome analgsicos recetados con narcticos para el dolor ms intenso A menudo se le da una receta para un fuerte control del dolor despus del alta (por ejemplo: oxicodona/Percocet, hidrocodona/Norco/Vicodin o tramadol/Ultram) Tome su medicamento para el dolor segn lo prescrito. Tenga en cuenta que la mayora de las recetas de narcticos tambin contienen Tylenol (acetaminofeno); evite tomar demasiado Tylenol. Si tiene problemas/inquietudes con el medicamento recetado (no controla el dolor, las nuseas, los vmitos, el sarpullido, la picazn, etc.), llmenos al [phone] para ver si necesitamos cambiarlo por otro para el dolor. medicamento que funcionar mejor para usted y/o controlar mejor sus efectos secundarios. Si necesita un resurtido de su medicamento para el dolor, debe llamar a la oficina antes de las 4:00 p. m. y solo entre semana. Por ley federal, las recetas de narcticos no se pueden pedir en una farmacia. Deben ser llenados en papel y recogidos en nuestra oficina por el paciente o cuidador autorizado. Las recetas no se pueden surtir despus de las 4 pm ni los fines de semana.  CUANDO LLAMARNOS [phone] Dolor intenso no controlado o que empeora Fiebre superior a 101 F (38,5 C) Inquietudes con la incisin: Empeoramiento del dolor, enrojecimiento, sarpullido/urticaria, hinchazn, sangrado o drenaje Reacciones/problemas con nuevos medicamentos (picazn, sarpullido, urticaria, nuseas, etc.) Nuseas y d/o vmitos Dificultad para orinar Respiracin dificultosa Empeoramiento de la fatiga,   mareos, aturdimiento, visin borrosa Otras preocupaciones Si no mejora despus de dos semanas o nota que empeora, comunquese con nuestra oficina al [phone] para obtener ms consejos. Es posible que necesitemos ajustar sus medicamentos, volver a evaluarlo en el consultorio, enviarlo a la  sala de emergencias o ver qu otras cosas podemos hacer para ayudarlo. El personal de la clnica est disponible para responder sus preguntas durante el horario comercial habitual (8:30 a. m. a 5 p. m.). No dude en llamar y pedir hablar con uno de nuestros enfermeros si tiene inquietudes clnicas. Un cirujano de [facility] est siempre de guardia en los hospitales las 24 horas del da.  Si tiene una emergencia mdica, vaya a la sala de emergencias ms cercana o llame al 911.  SEGUIMIENTO en nuestra oficina El da de su alta del hospital (o el siguiente da laborable de la semana), llame a [facility] para programar o confirmar una cita para ver a su cirujano en el consultorio para una cita de seguimiento. Por lo general, es de 2 a 3 semanas despus de la ciruga. Si tiene grapas para la piel en su(s) incisin(es), infrmele al consultorio para que podamos programar un horario en el consultorio para que la enfermera se las quite (generalmente alrededor de 10 das despus de la ciruga). Asegrese de llamar para programar citas el da del alta (o el siguiente da laborable de la semana) del hospital para garantizar una hora de cita conveniente. SI TIENE FORMULARIOS DE DISCAPACIDAD O LICENCIA FAMILIAR, LLEVARLOS A LA OFICINA PARA PROCESARLOS. NO SE LOS D A SU MDICO.  [facility], [place] [address], [address], [place], [place] [address] ? [phone] - Principal [phone] - Nmero gratuito, [phone] - Fax [email or web address]   LLEGAR A UNA BUENA SALUD INTESTINAL. Se espera que su tracto digestivo necesite algunos meses para volver a la normalidad. Es comn que sus evacuaciones intestinales y heces sean irregulares. Tendr hinchazn y calambres ocasionales que eventualmente desaparecern. Hasta que est comiendo alimentos slidos normalmente, deje de tomar todos los medicamentos para el dolor y regrese a sus actividades  regulares; sus intestinos no sern normales. Evitar el estreimiento El objetivo: UNA EVACUACIN INTESTINAL SUAVE AL DA! Beber mucho lquido. Elige agua primero. TOMA UN SUPLEMENTO DE FIBRA CADA DA EL RESTO DE TU VIDA Durante su primera semana de regreso a casa, agregue gradualmente un suplemento de fibra todos los das. Experimente qu forma puede tolerar. Hay muchas formas, como polvos, tabletas, obleas, gomitas, etc. Salvado de psyllium (Metamucil), metilcelulosa (Citrucel), Miralax o Glycolax, Benefiber, Linaza. Ajuste la dosis semana a semana (1/2 dosis/da a 6 dosis al da) hasta que est defecando 1-2 veces al da. Reduzca la dosis o pruebe con un producto de fibra diferente si le causa problemas como diarrea o distensin abdominal. A veces, se necesita un laxante para ayudar a impulsar los intestinos si est estreido hasta que el suplemento de fibra pueda ayudar a regular sus intestinos. Si tolera comer y se est tirando pedos, est bien probar un laxante suave como la dosis doble de MiraLax, jugo de ciruela pasa o leche de magnesia. Evite el uso de laxantes con demasiada frecuencia. Los ablandadores de heces a veces pueden ayudar a contrarrestar los efectos de estreimiento de los analgsicos narcticos. Tambin puede causar diarrea, as que evite usarlo por mucho tiempo. Si todava est estreido a pesar de tomar fibra diariamente, comer slidos y algunas dosis de laxantes, llame a nuestra oficina. Controlando la diarrea Intente   beber lquidos y comer alimentos blandos durante unos das para evitar estresar an ms sus intestinos. Evite los productos lcteos (especialmente la leche y el helado) por un corto tiempo. Los intestinos a menudo pueden perder la capacidad de digerir la lactosa cuando estn estresados. Evite los alimentos que causan gases o hinchazn. Los alimentos tpicos incluyen frijoles y otras legumbres, repollo, brcoli y productos lcteos. Evite las comidas grasosas,  picantes y rpidas. Cada persona tiene cierta sensibilidad a otros alimentos, as que escuche a su cuerpo y evite aquellos alimentos que le provoquen problemas. Los probiticos (como el yogur activo, Align, etc.) pueden ayudar a repoblar los intestinos y el colon con bacterias normales y calmar un tracto digestivo sensible Agregar un suplemento de fibra gradualmente puede ayudar a espesar las heces al absorber el exceso de lquido y volver a entrenar a los intestinos para que acten con ms normalidad. Aumente lentamente la dosis durante unas pocas semanas. Demasiada fibra demasiado pronto puede ser contraproducente y causar calambres e hinchazn. Est bien tratar de disminuir la diarrea con unas pocas dosis de medicamentos antidiarreicos. El subsalicilato de bismuto (por ejemplo, Kayopectate, Pepto Bismol) en algunas dosis puede ayudar a controlar la diarrea. Evitar si est embarazada. La loperamida (Imodium) puede retrasar la diarrea. Comience con una tableta (2 mg) primero. Evtelo si tiene fiebre o dolor intenso. LOS PACIENTES CON ILEOSTOMA TENDRN DIARREA CRNICA ya que su colon no est en uso. Beba muchos lquidos. Deber beber incluso ms vasos de agua/lquido al da para evitar deshidratarse. Registre el resultado de su ileostoma. Espere vaciar la bolsa cada 3-4 horas al principio. La mayora de las personas con una ileostoma permanente vace su bolsa 4-6 veces como mnimo. Use medicamentos antidiarreicos (especialmente Imodium) varias veces al da para evitar deshidratarse. Comience con una dosis a la hora de acostarse y desayunar. Ajuste hacia arriba o hacia abajo segn sea necesario. Aumente los medicamentos antidiarreicos segn las indicaciones para evitar vaciar la bolsa ms de 8 veces al da (cada 3 horas). Trabaje con su enfermera de ostoma de heridas para aprender a cuidar su ostoma. Consulte las instrucciones de cuidado de la ostoma. RESOLUCIN DE PROBLEMAS DE INTESTINOS  IRREGULARES 1) Comience con una dieta suave y blanda. Nada de comidas picantes, grasosas o fritas. 2) Evite el gluten/trigo o productos lcteos de la dieta para ver si los sntomas mejoran. 3) Miralax 17gm o semillas de lino mezcladas en 8oz. agua o jugo-diariamente. Puede usar 2-4 veces al da segn sea necesario. 4) Gas-X, Phazyme, etc. segn sea necesario para gases e hinchazn. 5) Prilosec (omeprazol) de venta libre segn sea necesario 6) Considere los probiticos (Align, Activa, etc.) para ayudar a calmar los intestinos  Llame a su mdico si est empeorando o no est mejorando. A veces, se pueden necesitar ms pruebas (cultivos, endoscopia, estudios de rayos X, tomografas computarizadas, anlisis de sangre, etc.) para ayudar a diagnosticar y tratar la causa de la diarrea. [facility] [place] [address], [address], [place], [place] [address] [phone] - Principal. [phone] - Llamada gratuita. [phone] - Fax [email or web address]   ##########################################  SURGERY: POST OP INSTRUCTIONS (Surgery for small bowel obstruction, colon resection, etc)   ######################################################################  EAT Gradually transition to a high fiber diet with a fiber supplement over the next few days after discharge  WALK Walk an hour a day.  Control your pain to do that.    CONTROL PAIN Control pain so that you can walk, sleep, tolerate sneezing/coughing, go up/down stairs.    HAVE A BOWEL MOVEMENT DAILY Keep your bowels regular to avoid problems.  OK to try a laxative to override constipation.  OK to use an antidairrheal to slow down diarrhea.  Call if not better after 2 tries  CALL IF YOU HAVE PROBLEMS/CONCERNS Call if you are still struggling despite following these instructions. Call if you have concerns not answered by these  instructions  ######################################################################   DIET Follow a light diet the first few days at home.  Start with a bland diet such as soups, liquids, starchy foods, low fat foods, etc.  If you feel full, bloated, or constipated, stay on a ful liquid or pureed/blenderized diet for a few days until you feel better and no longer constipated. Be sure to drink plenty of fluids every day to avoid getting dehydrated (feeling dizzy, not urinating, etc.). Gradually add a fiber supplement to your diet over the next week.  Gradually get back to a regular solid diet.  Avoid fast food or heavy meals the first week as you are more likely to get nauseated. It is expected for your digestive tract to need a few months to get back to normal.  It is common for your bowel movements and stools to be irregular.  You will have occasional bloating and cramping that should eventually fade away.  Until you are eating solid food normally, off all pain medications, and back to regular activities; your bowels will not be normal. Focus on eating a low-fat, high fiber diet the rest of your life (See Getting to Good Bowel Health, below).  CARE of your INCISION or WOUND  It is good for closed incisions and even open wounds to be washed every day.  Shower every day.  Short baths are fine.  Wash the incisions and wounds clean with soap & water.    You may leave closed incisions open to air if it is dry.   You may cover the incision with clean gauze & replace it after your daily shower for comfort.  If you have an open wound with a wound vac, see wound vac care instructions.    ACTIVITIES as tolerated Start light daily activities --- self-care, walking, climbing stairs-- beginning the day after surgery.  Gradually increase activities as tolerated.  Control your pain to be active.  Stop when you are tired.  Ideally, walk several times a day, eventually an hour a day.   Most people are back  to most day-to-day activities in a few weeks.  It takes 4-8 weeks to get back to unrestricted, intense activity. If you can walk 30 minutes without difficulty, it is safe to try more intense activity such as jogging, treadmill, bicycling, low-impact aerobics, swimming, etc. Save the most intensive and strenuous activity for last (Usually 4-8 weeks after surgery) such as sit-ups, heavy lifting, contact sports, etc.  Refrain from any intense heavy lifting or straining until you are off narcotics for pain control.  You will have off days, but things should improve week-by-week. DO NOT PUSH THROUGH PAIN.  Let pain be your guide: If it hurts to do something, don't do it.  Pain is your body warning you to avoid that activity for another week until the pain goes down. You may drive when you are no longer taking narcotic prescription pain medication, you can comfortably wear a seatbelt, and you can safely make sudden turns/stops to protect yourself without hesitating due to pain. You may have sexual intercourse when it is comfortable. If it hurts to   do something, stop.  MEDICATIONS Take your usually prescribed home medications unless otherwise directed.   Blood thinners:  Usually you can restart any strong blood thinners after the second postoperative day.  It is OK to take aspirin right away.     If you are on strong blood thinners (warfarin/Coumadin, Plavix, Xerelto, Eliquis, Pradaxa, etc), discuss with your surgeon, medicine PCP, and/or cardiologist for instructions on when to restart the blood thinner & if blood monitoring is needed (PT/INR blood check, etc).     PAIN CONTROL Pain after surgery or related to activity is often due to strain/injury to muscle, tendon, nerves and/or incisions.  This pain is usually short-term and will improve in a few months.  To help speed the process of healing and to get back to regular activity more quickly, DO THE FOLLOWING THINGS TOGETHER: Increase activity gradually.   DO NOT PUSH THROUGH PAIN Use Ice and/or Heat Try Gentle Massage and/or Stretching Take over the counter pain medication Take Narcotic prescription pain medication for more severe pain  Good pain control = faster recovery.  It is better to take more medicine to be more active than to stay in bed all day to avoid medications.  Increase activity gradually Avoid heavy lifting at first, then increase to lifting as tolerated over the next 6 weeks. Do not "push through" the pain.  Listen to your body and avoid positions and maneuvers than reproduce the pain.  Wait a few days before trying something more intense Walking an hour a day is encouraged to help your body recover faster and more safely.  Start slowly and stop when getting sore.  If you can walk 30 minutes without stopping or pain, you can try more intense activity (running, jogging, aerobics, cycling, swimming, treadmill, sex, sports, weightlifting, etc.) Remember: If it hurts to do it, then don't do it! Use Ice and/or Heat You will have swelling and bruising around the incisions.  This will take several weeks to resolve. Ice packs or heating pads (6-8 times a day, 30-60 minutes at a time) will help sooth soreness & bruising. Some people prefer to use ice alone, heat alone, or alternate between ice & heat.  Experiment and see what works best for you.  Consider trying ice for the first few days to help decrease swelling and bruising; then, switch to heat to help relax sore spots and speed recovery. Shower every day.  Short baths are fine.  It feels good!  Keep the incisions and wounds clean with soap & water.   Try Gentle Massage and/or Stretching Massage at the area of pain many times a day Stop if you feel pain - do not overdo it Take over the counter pain medication This helps the muscle and nerve tissues become less irritable and calm down faster Choose ONE of the following over-the-counter anti-inflammatory medications: Acetaminophen  500mg tabs (Tylenol) 1-2 pills with every meal and just before bedtime (avoid if you have liver problems or if you have acetaminophen in you narcotic prescription) Naproxen 220mg tabs (ex. Aleve, Naprosyn) 1-2 pills twice a day (avoid if you have kidney, stomach, IBD, or bleeding problems) Ibuprofen 200mg tabs (ex. Advil, Motrin) 3-4 pills with every meal and just before bedtime (avoid if you have kidney, stomach, IBD, or bleeding problems) Take with food/snack several times a day as directed for at least 2 weeks to help keep pain / soreness down & more manageable. Take Narcotic prescription pain medication for more severe pain A prescription for   strong pain control is often given to you upon discharge (for example: oxycodone/Percocet, hydrocodone/Norco/Vicodin, or tramadol/Ultram) Take your pain medication as prescribed. Be mindful that most narcotic prescriptions contain Tylenol (acetaminophen) as well - avoid taking too much Tylenol. If you are having problems/concerns with the prescription medicine (does not control pain, nausea, vomiting, rash, itching, etc.), please call us [phone] to see if we need to switch you to a different pain medicine that will work better for you and/or control your side effects better. If you need a refill on your pain medication, you must call the office before 4 pm and on weekdays only.  By federal law, prescriptions for narcotics cannot be called into a pharmacy.  They must be filled out on paper & picked up from our office by the patient or authorized caretaker.  Prescriptions cannot be filled after 4 pm nor on weekends.    WHEN TO CALL US [phone] Severe uncontrolled or worsening pain  Fever over 101 F (38.5 C) Concerns with the incision: Worsening pain, redness, rash/hives, swelling, bleeding, or drainage Reactions / problems with new medications (itching, rash, hives, nausea, etc.) Nausea and/or vomiting Difficulty urinating Difficulty  breathing Worsening fatigue, dizziness, lightheadedness, blurred vision Other concerns If you are not getting better after two weeks or are noticing you are getting worse, contact our office [phone] for further advice.  We may need to adjust your medications, re-evaluate you in the office, send you to the emergency room, or see what other things we can do to help. The clinic staff is available to answer your questions during regular business hours (8:30am-5pm).  Please don't hesitate to call and ask to speak to one of our nurses for clinical concerns.    A surgeon from [facility] is always on call at the hospitals 24 hours/day If you have a medical emergency, go to the nearest emergency room or call 911.  FOLLOW UP in our office One the day of your discharge from the hospital (or the next business weekday), please call [facility] to set up or confirm an appointment to see your surgeon in the office for a follow-up appointment.  Usually it is 2-3 weeks after your surgery.   If you have skin staples at your incision(s), let the office know so we can set up a time in the office for the nurse to remove them (usually around 10 days after surgery). Make sure that you call for appointments the day of discharge (or the next business weekday) from the hospital to ensure a convenient appointment time. IF YOU HAVE DISABILITY OR FAMILY LEAVE FORMS, BRING THEM TO THE OFFICE FOR PROCESSING.  DO NOT GIVE THEM TO YOUR DOCTOR.  [facility] [address], [address], [place], [place]  [address] ? [phone] - Main [phone] - Toll Free,  [phone] - Fax [email or web address]    GETTING TO GOOD BOWEL HEALTH. It is expected for your digestive tract to need a few months to get back to normal.  It is common for your bowel movements and stools to be irregular.  You will have occasional bloating and cramping that should eventually fade  away.  Until you are eating solid food normally, off all pain medications, and back to regular activities; your bowels will not be normal.   Avoiding constipation The goal: ONE SOFT BOWEL MOVEMENT A DAY!    Drink plenty of fluids.  Choose water first. TAKE A FIBER SUPPLEMENT EVERY DAY   THE REST OF YOUR LIFE During your first week back home, gradually add back a fiber supplement every day Experiment which form you can tolerate.   There are many forms such as powders, tablets, wafers, gummies, etc Psyllium bran (Metamucil), methylcellulose (Citrucel), Miralax or Glycolax, Benefiber, Flax Seed.  Adjust the dose week-by-week (1/2 dose/day to 6 doses a day) until you are moving your bowels 1-2 times a day.  Cut back the dose or try a different fiber product if it is giving you problems such as diarrhea or bloating. Sometimes a laxative is needed to help jump-start bowels if constipated until the fiber supplement can help regulate your bowels.  If you are tolerating eating & you are farting, it is okay to try a gentle laxative such as double dose MiraLax, prune juice, or Milk of Magnesia.  Avoid using laxatives too often. Stool softeners can sometimes help counteract the constipating effects of narcotic pain medicines.  It can also cause diarrhea, so avoid using for too long. If you are still constipated despite taking fiber daily, eating solids, and a few doses of laxatives, call our office. Controlling diarrhea Try drinking liquids and eating bland foods for a few days to avoid stressing your intestines further. Avoid dairy products (especially milk & ice cream) for a short time.  The intestines often can lose the ability to digest lactose when stressed. Avoid foods that cause gassiness or bloating.  Typical foods include beans and other legumes, cabbage, broccoli, and dairy foods.  Avoid greasy, spicy, fast foods.  Every person has some sensitivity to other foods, so listen to your body and avoid those  foods that trigger problems for you. Probiotics (such as active yogurt, Align, etc) may help repopulate the intestines and colon with normal bacteria and calm down a sensitive digestive tract Adding a fiber supplement gradually can help thicken stools by absorbing excess fluid and retrain the intestines to act more normally.  Slowly increase the dose over a few weeks.  Too much fiber too soon can backfire and cause cramping & bloating. It is okay to try and slow down diarrhea with a few doses of antidiarrheal medicines.   Bismuth subsalicylate (ex. Kayopectate, Pepto Bismol) for a few doses can help control diarrhea.  Avoid if pregnant.   Loperamide (Imodium) can slow down diarrhea.  Start with one tablet (2mg) first.  Avoid if you are having fevers or severe pain.  ILEOSTOMY PATIENTS WILL HAVE CHRONIC DIARRHEA since their colon is not in use.    Drink plenty of liquids.  You will need to drink even more glasses of water/liquid a day to avoid getting dehydrated. Record output from your ileostomy.  Expect to empty the bag every 3-4 hours at first.  Most people with a permanent ileostomy empty their bag 4-6 times at the least.   Use antidiarrheal medicine (especially Imodium) several times a day to avoid getting dehydrated.  Start with a dose at bedtime & breakfast.  Adjust up or down as needed.  Increase antidiarrheal medications as directed to avoid emptying the bag more than 8 times a day (every 3 hours). Work with your wound ostomy nurse to learn care for your ostomy.  See ostomy care instructions. TROUBLESHOOTING IRREGULAR BOWELS 1) Start with a soft & bland diet. No spicy, greasy, or fried foods.  2) Avoid gluten/wheat or dairy products from diet to see if symptoms improve. 3) Miralax 17gm or flax seed mixed in 8oz. water or juice-daily. May use 2-4 times a   day as needed. 4) Gas-X, Phazyme, etc. as needed for gas & bloating.  5) Prilosec (omeprazole) over-the-counter as needed 6)  Consider  probiotics (Align, Activa, etc) to help calm the bowels down  Call your doctor if you are getting worse or not getting better.  Sometimes further testing (cultures, endoscopy, X-ray studies, CT scans, bloodwork, etc.) may be needed to help diagnose and treat the cause of the diarrhea. [facility] [address], [address], [place], [place]  [address] [phone] - Main.    [phone]  - Toll Free.   [phone] - Fax [email or web address]   #######################################################  Ostomy Support Information  You've heard that people get along just fine with only one of their eyes, or one of their lungs, or one of their kidneys. But you also know that you have only one intestine and only one bladder, and that leaves you feeling awfully empty, both physically and emotionally: You think no other people go around without part of their intestine with the ends of their intestines sticking out through their abdominal walls.   YOU ARE NOT ALONE.  There are nearly three quarters of a million people in the [place] who have an ostomy; people who have had surgery to remove all or part of their colons or bladders.   There is even a national association, the [employer] with over 350 local affiliated support groups that are organized by volunteers who provide peer support and counseling. [employer] has a toll free telephone num-ber, 800-826-0826 and an educational, interactive website, www.ostomy.org   An ostomy is an opening in the belly (abdominal wall) made by surgery. Ostomates are people who have had this procedure. The opening (stoma) allows the kidney or bowel to grdischarge waste. An external pouch covers the stoma to collect waste. Pouches are are a simple bag and are odor free. Different companies have disposable or reusable pouches to fit one's lifestyle. An ostomy can either be temporary or permanent.   THERE ARE THREE MAIN  TYPES OF OSTOMIES Colostomy. A colostomy is a surgically created opening in the large intestine (colon). Ileostomy. An ileostomy is a surgically created opening in the small intestine. Urostomy. A urostomy is a surgically created opening to divert urine away from the bladder.  OSTOMY Care  The following guidelines will make care of your colostomy easier. Keep this information close by for quick reference.  Helpful DIET hints Eat a well-balanced diet including vegetables and fresh fruits. Eat on a regular schedule.  Drink at least 6 to 8 glasses of fluids daily. Eat slowly in a relaxed atmosphere. Chew your food thoroughly. Avoid chewing gum, smoking, and drinking from a straw. This will help decrease the amount of air you swallow, which may help reduce gas. Eating yogurt or drinking buttermilk may help reduce gas.  To control gas at night, do not eat after 8 p.m. This will give your bowel time to quiet down before you go to bed.  If gas is a problem, you can purchase Beano. Sprinkle Beano on the first bite of food before eating to reduce gas. It has no flavor and should not change the taste of your food. You can buy Beano over the counter at your local drugstore.  Foods like fish, onions, garlic, broccoli, asparagus, and cabbage produce odor. Although your pouch is odor-proof, if you eat these foods you may notice a stronger odor when emptying your pouch. If this is a concern, you may want to   limit these foods in your diet.  If you have an ileostomy, you will have chronic diarrhea & need to drink more liquids to avoid getting dehydrated.  Consider antidiarrheal medicine like imodium (loperamide) or Lomotil to help slow down bowel movements / diarrhea into your ileostomy bag.  GETTING TO GOOD BOWEL HEALTH WITH AN ILEOSTOMY    With the colon bypassed & not in use, you will have small bowel diarrhea.   It is important to thicken & slow your bowel movements down.   The goal: 4-6 small BOWEL  MOVEMENTS A DAY It is important to drink plenty of liquids to avoid getting dehydrated  CONTROLLING ILEOSTOMY DIARRHEA  TAKE A FIBER SUPPLEMENT (FiberCon or Benefiner soluble fiber) twice a day - to thicken stools by absorbing excess fluid and retrain the intestines to act more normally.  Slowly increase the dose over a few weeks.  Too much fiber too soon can backfire and cause cramping & bloating.  TAKE AN IRON SUPPLEMENT twice a day to naturally constipate your bowels.  Usually ferrous sulfate 325mg twice a day)  TAKE ANTI-DIARRHEAL MEDICINES: Loperamide (Imodium) can slow down diarrhea.  Start with two tablets (= 4mg) first and then try one tablet every 6 hours.  Can go up to 2 pills four times day (8 pills of 2mg max) Avoid if you are having fevers or severe pain.  If you are not better or start feeling worse, stop all medicines and call your doctor for advice LoMotil (Diphenoxylate / Atropine) is another medicine that can constipate & slow down bowel moevements Pepto Bismol (bismuth) can gently thicken bowels as well  If diarrhea is worse,: drink plenty of liquids and try simpler foods for a few days to avoid stressing your intestines further. Avoid dairy products (especially milk & ice cream) for a short time.  The intestines often can lose the ability to digest lactose when stressed. Avoid foods that cause gassiness or bloating.  Typical foods include beans and other legumes, cabbage, broccoli, and dairy foods.  Every person has some sensitivity to other foods, so listen to our body and avoid those foods that trigger problems for you.Call your doctor if you are getting worse or not better.  Sometimes further testing (cultures, endoscopy, X-ray studies, bloodwork, etc) may be needed to help diagnose and treat the cause of the diarrhea. Take extra anti-diarrheal medicines (maximum is 8 pills of 2mg loperamide a day)   Tips for POUCHING an OSTOMY   Changing Your Pouch The best time to  change your pouch is in the morning, before eating or drinking anything. Your stoma can function at any time, but it will function more after eating or drinking.   Applying the pouching system  Place all your equipment close at hand before removing your pouch.  Wash your hands.  Stand or sit in front of a mirror. Use the position that works best for you. Remember that you must keep the skin around the stoma wrinkle-free for a good seal.  Gently remove the used pouch (1-piece system) or the pouch and old wafer (2-piece system). Empty the pouch into the toilet. Save the closure clip to use again.  Wash the stoma itself and the skin around the stoma. Your stoma may bleed a little when being washed. This is normal. Rinse and pat dry. You may use a wash cloth or soft paper towels (like Bounty), mild soap (like Dial, Safeguard, or Ivory), and water. Avoid soaps that contain perfumes or lotions.    For a new pouch (1-piece system) or a new wafer (2-piece system), measure your stoma using the stoma guide in each box of supplies.  Trace the shape of your stoma onto the back of the new pouch or the back of the new wafer. Cut out the opening. Remove the paper backing and set it aside.  Optional: Apply a skin barrier powder to surrounding skin if it is irritated (bare or weeping), and dust off the excess. Optional: Apply a skin-prep wipe (such as Skin Prep or All-Kare) to the skin around the stoma, and let it dry. Do not apply this solution if the skin is irritated (red, tender, or broken) or if you have shaved around the stoma. Optional: Apply a skin barrier paste (such as Stomahesive, Coloplast, or Premium) around the opening cut in the back of the pouch or wafer. Allow it to dry for 30 to 60 seconds.  Hold the pouch (1-piece system) or wafer (2-piece system) with the sticky side toward your body. Make sure the skin around the stoma is wrinkle-free. Center the opening on the stoma, then press firmly to  your abdomen (Fig. 4). Look in the mirror to check if you are placing the pouch, or wafer, in the right position. For a 2-piece system, snap the pouch onto the wafer. Make sure it snaps into place securely.  Place your hand over the stoma and the pouch or wafer for about 30 seconds. The heat from your hand can help the pouch or wafer stick to your skin.  Add deodorant (such as Super Banish or Nullo) to your pouch. Other options include food extracts such as vanilla oil and peppermint extract. Add about 10 drops of the deodorant to the pouch. Then apply the closure clamp. Note: Do not use toxic  chemicals or commercial cleaning agents in your pouch. These substances may harm the stoma.  Optional: For extra seal, apply tape to all 4 sides around the pouch or wafer, as if you were framing a picture. You may use any brand of medical adhesive tape. Change your pouch every 5 to 7 days. Change it immediately if a leak occurs.  Wash your hands afterwards.  If you are wearing a 2-piece system, you may use 2 new pouches per week and alternate them. Rinse the pouch with mild soap and warm water and hang it to dry for the next day. Apply the fresh pouch. Alternate the 2 pouches like this for a week. After a week, change the wafer and begin with 2 new pouches. Place the old pouches in a plastic bag, and put them in the trash.   LIVING WITH AN OSTOMY  Emptying Your Pouch Empty your pouch when it is one-third full (of urine, stool, and/or gas). If you wait until your pouch is fuller than this, it will be more difficult to empty and more noticeable. When you empty your pouch, either put toilet paper in the toilet bowl first, or flush the toilet while you empty the pouch. This will reduce splashing. You can empty the pouch between your legs or to one side while sitting, or while standing or stooping. If you have a 2-piece system, you can snap off the pouch to empty it. Remember that your stoma may function during  this time. If you wish to rinse your pouch after you empty it, a turkey baster can be helpful. When using a baster, squirt water up into the pouch through the opening at the bottom. With a 2-piece system,   you can snap off the pouch to rinse it. After rinsing  your pouch, empty it into the toilet. When rinsing your pouch at home, put a few granules of Dreft soap in the rinse water. This helps lubricate and freshen your pouch. The inside of your pouch can be sprayed with non-stick cooking oil (Pam spray). This may help reduce stool sticking to the inside of the pouch.  Bathing You may shower or bathe with your pouch on or off. Remember that your stoma may function during this time.  The materials you use to wash your stoma and the skin around it should be clean, but they do not need to be sterile.  Wearing Your Pouch During hot weather, or if you perspire a lot in general, wear a cover over your pouch. This may prevent a rash on your skin under the pouch. Pouch covers are sold at ostomy supply stores. Wear the pouch inside your underwear for better support. Watch your weight. Any gain or loss of 10 to 15 pounds or more can change the way your pouch fits.  Going Away From Home A collapsible cup (like those that come in travel kits) or a soft plastic squirt bottle with a pull-up top (like a travel bottle for shampoo) can be used for rinsing your pouch when you are away from home. Tilt the opening of the pouch at an upward angle when using a cup to rinse.  Carry wet wipes or extra tissues to use in public bathrooms.  Carry an extra pouching system with you at all times.  Never keep ostomy supplies in the glove compartment of your car. Extreme heat or cold can damage the skin barriers and adhesive wafers on the pouch.  When you travel, carry your ostomy supplies with you at all times. Keep them within easy reach. Do not pack ostomy supplies in baggage that will be checked or otherwise separated  from you, because your baggage might be lost. If you're traveling out of the country, it is helpful to have a letter stating that you are carrying ostomy supplies as a medical necessity.  If you need ostomy supplies while traveling, look in the yellow pages of the telephone book under "Surgical Supplies." Or call the local ostomy organization to find out where supplies are available.  Do not let your ostomy supplies get low. Always order new pouches before you use the last one.  Reducing Odor Limit foods such as broccoli, cabbage, onions, fish, and garlic in your diet to help reduce odor. Each time you empty your pouch, carefully clean the opening of the pouch, both inside and outside, with toilet paper. Rinse your pouch 1 or 2 times daily after you empty it (see directions for emptying your pouch and going away from home). Add deodorant (such as Super Banish or Nullo) to your pouch. Use air deodorizers in your bathroom. Do not add aspirin to your pouch. Even though aspirin can help prevent odor, it could cause ulcers on your stoma.  When to call the doctor Call the doctor if you have any of the following symptoms: Purple, black, or white stoma Severe cramps lasting more than 6 hours Severe watery discharge from the stoma lasting more than 6 hours No output from the colostomy for 3 days Excessive bleeding from your stoma Swelling of your stoma to more than 1/2-inch larger than usual Pulling inward of your stoma below skin level Severe skin irritation or deep ulcers Bulging or other changes in your abdomen    When to call your ostomy nurse Call your ostomy/enterostomal therapy (WOCN) nurse if any of the following occurs: Frequent leaking of your pouching system Change in size or appearance of your stoma, causing discomfort or problems with your pouch Skin rash or rawness Weight gain or loss that causes problems with your pouch     FREQUENTLY ASKED QUESTIONS   Why haven't you met  any of these folks who have an ostomy?  Well, maybe you have! You just did not recognize them because an ostomy doesn't show. It can be kept secret if you wish. Why, maybe some of your best friends, office associates or neighbors have an ostomy ... you never can tell. People facing ostomy surgery have many quality-of-life questions like: Will you bulge? Smell? Make noises? Will you feel waste leaving your body? Will you be a captive of the toilet? Will you starve? Be a social outcast? Get/stay married? Have babies? Easily bathe, go swimming, bend over?  OK, let's look at what you can expect:   Will you bulge?  Remember, without part of the intestine or bladder, and its contents, you should have a flatter tummy than before. You can expect to wear, with little exception, what you wore before surgery ... and this in-cludes tight clothing and bathing suits.   Will you smell?  Today, thanks to modern odor proof pouching systems, you can walk into an ostomy support group meeting and not smell anything that is foul or offensive. And, for those with an ileostomy or colostomy who are concerned about odor when emptying their pouch, there are in-pouch deodorants that can be used to eliminate any waste odors that may exist.   Will you make noises?  Everyone produces gas, especially if they are an air-swallower. But intestinal sounds that occur from time to time are no differ-ent than a gurgling tummy, and quite often your clothing will muffle any sounds.   Will you feel the waste discharges?  For those with a colostomy or ileostomy there might be a slight pressure when waste leaves your body, but understand that the intestines have no nerve endings, so there will be no unpleasant sensations. Those with a urostomy will probably be unaware of any kidney drainage.   Will you be a captive of the toilet?  Immediately post-op you will spend more time in the bathroom than you will after your body recovers from  surgery. Every person is different, but on average those with an ileostomy or urostomy may empty their pouches 4 to 6 times a day; a little  less if you have a colostomy. The average wear time between pouch system changes is 3 to 5 days and the changing process should take less than 30 minutes.   Will I need to be on a special diet? Most people return to their normal diet when they have recovered from surgery. Be sure to chew your food well, eat a well-balanced diet and drink plenty of fluids. If you experience problems with a certain food, wait a couple of weeks and try it again.  Will there be odor and noises? Pouching systems are designed to be odor-proof or odor-resistant. There are deodorants that can be used in the pouch. Medications are also available to help reduce odor. Limit gas-producing foods and carbonated beverages. You will experience less gas and fewer noises as you heal from surgery.  How much time will it take to care for my ostomy? At first, you may spend a lot of time learning   about your ostomy and how to take care of it. As you become more comfortable and skilled at changing the pouching system, it will take very little time to care for it.   Will I be able to return to work? People with ostomies can perform most jobs. As soon as you have healed from surgery, you should be able to return to work. Heavy lifting (more than 10 pounds) may be discouraged.   What about intimacy? Sexual relationships and intimacy are important and fulfilling aspects of your life. They should continue after ostomy surgery. Intimacy-related concerns should be discussed openly between you and your partner.   Can I wear regular clothing? You do not need to wear special clothing. Ostomy pouches are fairly flat and barely noticeable. Elastic undergarments will not hurt the stoma or prevent the ostomy from functioning.   Can I participate in sports? An ostomy should not limit your involvement in sports.  Many people with ostomies are runners, skiers, swimmers or participate in other active lifestyles. Talk with your caregiver first before doing heavy physical activity.  Will you starve?  Not if you follow doctor's orders at each stage of your post-op adjustment. There is no such thing as an "ostomy diet". Some people with an ostomy will be able to eat and tolerate anything; others may find diffi-culty with some foods. Each person is an individual and must determine, by trial, what is best for them. A good practice for all is to drink plenty of water.   Will you be a social outcast?  Have you met anyone who has an ostomy and is a social outcast? Why should you be the first? Only your attitude and self image will effect how you are treated. No confi-dent person is an outcast.    PROFESSIONAL HELP   Resources are available if you need help or have questions about your ostomy.   Specially trained nurses called Wound, Ostomy Continence Nurses (WOCN) are available for consultation in most major medical centers.  Consider getting an ostomy consult at an outpatient ostomy clinic.   [place] has an Ostomy Clinic run by an WOCN ostomy nurse at the [facility].  [phone]. [facility] can help set up an appointment   The United Ostomy Association (UOA) is a group made up of many local chapters throughout the [place]. These local groups hold meetings and provide support to prospective and existing ostomates. They sponsor educational events and have qualified visitors to make personal or telephone visits. Contact the UOA for the chapter nearest you and for other educational publications.  More detailed information can be found in Colostomy Guide, a publication of the United Ostomy Association (UOA). Contact UOA at 1-800-826-0826 or visit their web site at www.uoaa.org. The website contains links to other sites, suppliers and resources.  [facility] Secure Start  Services: Start at the website to enlist for support.  Your Wound Ostomy (WOCN) nurse may have started this process. https://www.[employer].com/en/securestart Secure Start services are designed to support people as they live their lives with an ostomy or neurogenic bladder. Enrolling is easy and at no cost to the patient. We realize that each person's needs and life journey are different. Through Secure Start services, we want to help people live their life, their way.  #######################################################   Managing Your Pain After Surgery Without Opioids    Thank you for participating in our program to help patients manage their pain after surgery without opioids. This is part of   our effort to provide you with the best care possible, without exposing you or your family to the risk that opioids pose.  What pain can I expect after surgery? You can expect to have some pain after surgery. This is normal. The pain is typically worse the day after surgery, and quickly begins to get better. Many studies have found that many patients are able to manage their pain after surgery with Over-the-Counter (OTC) medications such as Tylenol and Motrin. If you have a condition that does not allow you to take Tylenol or Motrin, notify your surgical team.  How will I manage my pain? The best strategy for controlling your pain after surgery is around the clock pain control with Tylenol (acetaminophen) and Motrin (ibuprofen or Advil). Alternating these medications with each other allows you to maximize your pain control. In addition to Tylenol and Motrin, you can use heating pads or ice packs on your incisions to help reduce your pain.  How will I alternate your regular strength over-the-counter pain medication? You will take a dose of pain medication every three hours. Start by taking 650 mg of Tylenol (2 pills of 325 mg) 3 hours later take 600 mg of Motrin (3 pills of 200 mg) 3 hours after  taking the Motrin take 650 mg of Tylenol 3 hours after that take 600 mg of Motrin.   - 1 -  See example - if your first dose of Tylenol is at 12:00 PM   12:00 PM Tylenol 650 mg (2 pills of 325 mg)  3:00 PM Motrin 600 mg (3 pills of 200 mg)  6:00 PM Tylenol 650 mg (2 pills of 325 mg)  9:00 PM Motrin 600 mg (3 pills of 200 mg)  Continue alternating every 3 hours   We recommend that you follow this schedule around-the-clock for at least 3 days after surgery, or until you feel that it is no longer needed. Use the table on the last page of this handout to keep track of the medications you are taking. Important: Do not take more than 3000mg of Tylenol or 3200mg of Motrin in a 24-hour period. Do not take ibuprofen/Motrin if you have a history of bleeding stomach ulcers, severe kidney disease, &/or actively taking a blood thinner  What if I still have pain? If you have pain that is not controlled with the over-the-counter pain medications (Tylenol and Motrin or Advil) you might have what we call "breakthrough" pain. You will receive a prescription for a small amount of an opioid pain medication such as Oxycodone, Tramadol, or Tylenol with Codeine. Use these opioid pills in the first 24 hours after surgery if you have breakthrough pain. Do not take more than 1 pill every 4-6 hours.  If you still have uncontrolled pain after using all opioid pills, don't hesitate to call our staff using the number provided. We will help make sure you are managing your pain in the best way possible, and if necessary, we can provide a prescription for additional pain medication.   Day 1    Time  Name of Medication Number of pills taken  Amount of Acetaminophen  Pain Level   Comments  AM PM       AM PM       AM PM       AM PM       AM PM       AM PM       AM PM         AM PM       Total Daily amount of Acetaminophen Do not take more than  3,000 mg per day      Day 2    Time  Name of Medication  Number of pills taken  Amount of Acetaminophen  Pain Level   Comments  AM PM       AM PM       AM PM       AM PM       AM PM       AM PM       AM PM       AM PM       Total Daily amount of Acetaminophen Do not take more than  3,000 mg per day      Day 3    Time  Name of Medication Number of pills taken  Amount of Acetaminophen  Pain Level   Comments  AM PM       AM PM       AM PM       AM PM         AM PM       AM PM       AM PM       AM PM       Total Daily amount of Acetaminophen Do not take more than  3,000 mg per day      Day 4    Time  Name of Medication Number of pills taken  Amount of Acetaminophen  Pain Level   Comments  AM PM       AM PM       AM PM       AM PM       AM PM       AM PM       AM PM       AM PM       Total Daily amount of Acetaminophen Do not take more than  3,000 mg per day      Day 5    Time  Name of Medication Number of pills taken  Amount of Acetaminophen  Pain Level   Comments  AM PM       AM PM       AM PM       AM PM       AM PM       AM PM       AM PM       AM PM       Total Daily amount of Acetaminophen Do not take more than  3,000 mg per day      Day 6    Time  Name of Medication Number of pills taken  Amount of Acetaminophen  Pain Level  Comments  AM PM       AM PM       AM PM       AM PM       AM PM       AM PM       AM PM       AM PM       Total Daily amount of Acetaminophen Do not take more than  3,000 mg per day      Day 7    Time  Name of Medication Number of pills taken  Amount of Acetaminophen  Pain Level   Comments    AM PM       AM PM       AM PM       AM PM       AM PM       AM PM       AM PM       AM PM       Total Daily amount of Acetaminophen Do not take more than  3,000 mg per day        For additional information about how and where to safely dispose of unused opioid medications - https://www.morepowerfulnc.org  Disclaimer: This document contains  information and/or instructional materials adapted from [facility] for the typical patient with your condition. It does not replace medical advice from your health care provider because your experience may differ from that of the typical patient. Talk to your health care provider if you have any questions about this document, your condition or your treatment plan. Adapted from [facility]  

## 2022-05-02 NOTE — Plan of Care (Signed)

## 2022-05-02 NOTE — Consult Note (Signed)
   [facility] Inpatient Consult   05/02/2022  Ross Reed [birth year] [id number]  Orientation with Natividad Brood, [facility] Liaison for review.  Location: [facility] Liaison met with pt and significant other Ross Reed) at bedside at [facility]) [place] Patient: Insurance Scientist, clinical (histocompatibility and immunogenetics))    Primary Care Provider: Patient, No Pcp Per  Patient screened for less than 30 days readmission hospitalization with noted medium risk score for unplanned readmission risk to assess for potential [facility]) Care Management service needs for post hospital transition for care coordination.  He is noted as a transfer from [facility]. Introduced [facility] and possible benefits of services.    Plan:  HIPAA verified and verified pt does not have a current a PCP. [facility] RN  offered resource for the "Find A Doctor" hotline through [facility]  and ellaborate on the benefits of having a PCP to avoid future hospitalizations. Pt receptive to the information and information was provided to the [place] (significant other).  [place] does not replace or interfere with any arrangements made by the Inpatient Transition of Care team.   For questions contact:   Raina Mina, RN Care Management Coordinator [place] Office [phone]
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
## 2022-05-02 NOTE — Social Work (Signed)
CSW acknowledges consult for SU resources and education. CSW met with pt at bedside, pt confirms he has several shots a night. Pt states it's not a big deal and that it is being blown out of proportion. Pt denies needing resources and education, and declines. TOC available for any further needs.

## 2022-05-03 LAB — SURGICAL PATHOLOGY

## 2022-05-04 LAB — TYPE AND SCREEN
ABO/RH(D): A POS
Antibody Screen: NEGATIVE
Unit division: 0
Unit division: 0
Unit division: 0
Unit division: 0

## 2022-05-04 LAB — BPAM RBC
Blood Product Expiration Date: 202403082359
Blood Product Expiration Date: 202403082359
Blood Product Expiration Date: 202403092359
Blood Product Expiration Date: 202403092359
ISSUE DATE / TIME: 202402241506
ISSUE DATE / TIME: 202402250922
Unit Type and Rh: 6200
Unit Type and Rh: 6200
Unit Type and Rh: 6200
Unit Type and Rh: 6200

## 2022-05-06 ENCOUNTER — Ambulatory Visit: Payer: Self-pay

## 2022-05-06 NOTE — Telephone Encounter (Signed)
  Chief Complaint: needs more pain medication Symptoms: pain Frequency:  Pertinent Negatives: Patient denies  Disposition: '[]'$ ED /'[]'$ Urgent Care (no appt availability in office) / '[]'$ Appointment(In office/virtual)/ '[]'$  [place] [facility]/ '[]'$ Home Care/ '[]'$ Refused Recommended Disposition /'[]'$ [place] [facility]/ '[x]'$  Follow-up with PCP Additional Notes: Spoke with pt's niece Coralee Pesa with Pt's consent. Pt had sx this week and is getting low on pain medication. Pt was waiting for call back regarding follow up appt.  Gave pt phone number for Dr. Thermon Leyland. They will call there for advice. They will call back if needed.  Reason for Disposition  [1] Caller requesting NON-URGENT health information AND [2] PCP's office is the best resource  Answer Assessment - Initial Assessment Questions 1. REASON FOR CALL or QUESTION: "What is your reason for calling today?" or "How can I best help you?" or "What question do you have that I can help answer?"     Pt had Sx and needs more pain medication.  Protocols used: Information Only Call - No Triage-A-AH

## 2022-05-12 ENCOUNTER — Other Ambulatory Visit (HOSPITAL_COMMUNITY): Payer: Self-pay

## 2022-05-12 MED ORDER — OXYCODONE HCL 5 MG PO TABS
5.0000 mg | ORAL_TABLET | Freq: Four times a day (QID) | ORAL | 0 refills | Status: DC | PRN
  Filled 2022-05-12: qty 10, 3d supply, fill #0

## 2022-05-31 ENCOUNTER — Encounter: Payer: Self-pay | Admitting: Nurse Practitioner

## 2022-05-31 ENCOUNTER — Ambulatory Visit (INDEPENDENT_AMBULATORY_CARE_PROVIDER_SITE_OTHER): Payer: 59 | Admitting: Nurse Practitioner

## 2022-05-31 VITALS — BP 120/58 | HR 101 | Temp 98.8°F | Ht 74.0 in | Wt 196.9 lb

## 2022-05-31 DIAGNOSIS — R109 Unspecified abdominal pain: Secondary | ICD-10-CM

## 2022-05-31 DIAGNOSIS — Z9081 Acquired absence of spleen: Secondary | ICD-10-CM | POA: Insufficient documentation

## 2022-05-31 NOTE — Assessment & Plan Note (Addendum)
Advised patient I would not give him additional pain medications. He was last prescribed 10 tablets on 05/12/2022 by his surgeon and advised to use OTC pain medications. I also advised him I was unable to clear him for work at this time, as he was told he had light activity restrictions until 06/12/2022, then could increase activity as tolerated. He does work a physical job. Recommended contacting his surgeon for additional follow up and work release. Patient declined physical exam, offered patient lab work, and imaging he also declined. He did pull up his shirt to show where his incision was hurting- no redness or drainage was noted around incision. Patient became agitated that I would not give him pain medication and left the room in the middle of the appointment.
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
## 2023-11-13 ENCOUNTER — Other Ambulatory Visit: Payer: Self-pay

## 2023-11-13 ENCOUNTER — Encounter: Payer: Self-pay | Admitting: Emergency Medicine

## 2023-11-13 ENCOUNTER — Emergency Department: Payer: Self-pay

## 2023-11-13 ENCOUNTER — Emergency Department
Admission: EM | Admit: 2023-11-13 | Discharge: 2023-11-13 | Disposition: A | Discharge status: Home / Self Care | Payer: Self-pay

## 2023-11-13 DIAGNOSIS — R0602 Shortness of breath: Secondary | ICD-10-CM | POA: Insufficient documentation

## 2023-11-13 DIAGNOSIS — F1029 Alcohol dependence with unspecified alcohol-induced disorder: Secondary | ICD-10-CM | POA: Insufficient documentation

## 2023-11-13 DIAGNOSIS — R079 Chest pain, unspecified: Secondary | ICD-10-CM | POA: Insufficient documentation

## 2023-11-13 DIAGNOSIS — F17200 Nicotine dependence, unspecified, uncomplicated: Secondary | ICD-10-CM | POA: Insufficient documentation

## 2023-11-13 DIAGNOSIS — I259 Chronic ischemic heart disease, unspecified: Secondary | ICD-10-CM

## 2023-11-13 DIAGNOSIS — Z87891 Personal history of nicotine dependence: Secondary | ICD-10-CM

## 2023-11-13 LAB — BASIC METABOLIC PANEL WITH GFR
Anion gap: 11 (ref 5–15)
BUN: 17 mg/dL (ref 6–20)
CO2: 24 mmol/L (ref 22–32)
Calcium: 9.1 mg/dL (ref 8.9–10.3)
Chloride: 103 mmol/L (ref 98–111)
Creatinine, Ser: 0.81 mg/dL (ref 0.61–1.24)
GFR, Estimated: >60 mL/min (ref >60)
Glucose, Bld: 101 mg/dL — ABNORMAL HIGH (ref 70–99)
Potassium: 4 mmol/L (ref 3.5–5.1)
Sodium: 138 mmol/L (ref 135–145)

## 2023-11-13 LAB — CBC
HCT: 45.6 % (ref 39.0–52.0)
Hemoglobin: 14.8 g/dL (ref 13.0–17.0)
MCH: 29 pg (ref 26.0–34.0)
MCHC: 32.5 g/dL (ref 30.0–36.0)
MCV: 89.2 fL (ref 80.0–100.0)
Platelets: 338 K/uL (ref 150–400)
RBC: 5.11 MIL/uL (ref 4.22–5.81)
RDW: 14.3 % (ref 11.5–15.5)
WBC: 12.2 K/uL — ABNORMAL HIGH (ref 4.0–10.5)
nRBC: 0 % (ref 0.0–0.2)

## 2023-11-13 LAB — HEPATIC FUNCTION PANEL
ALT: 51 U/L — ABNORMAL HIGH (ref 0–44)
AST: 48 U/L — ABNORMAL HIGH (ref 15–41)
Albumin: 3.4 g/dL — ABNORMAL LOW (ref 3.5–5.0)
Alkaline Phosphatase: 62 U/L (ref 38–126)
Bilirubin, Direct: <0.1 mg/dL (ref 0.0–0.2)
Total Bilirubin: 0.4 mg/dL (ref 0.0–1.2)
Total Protein: 6.6 g/dL (ref 6.5–8.1)

## 2023-11-13 LAB — TROPONIN I (HIGH SENSITIVITY)
Troponin I (High Sensitivity): 6 ng/L (ref <18)
Troponin I (High Sensitivity): 5 ng/L (ref <18)

## 2023-11-13 LAB — LIPASE, BLOOD: Lipase: 47 U/L (ref 11–51)

## 2023-11-13 MED ORDER — ASPIRIN 81 MG PO CHEW
324.0000 mg | CHEWABLE_TABLET | Freq: Once | ORAL | Status: AC
  Administered 2023-11-13: 324 mg via ORAL
  Filled 2023-11-13: qty 4

## 2023-11-13 MED ORDER — IPRATROPIUM-ALBUTEROL 0.5-2.5 (3) MG/3ML IN SOLN
3.0000 mL | Freq: Once | RESPIRATORY_TRACT | Status: AC
  Administered 2023-11-13: 3 mL via RESPIRATORY_TRACT
  Filled 2023-11-13: qty 3

## 2023-11-13 MED ORDER — FAMOTIDINE 20 MG PO TABS: 20.0000 mg | ORAL_TABLET | Freq: Every day | ORAL | 0 refills | Status: AC
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
## 2023-11-13 NOTE — ED Notes (Signed)
 CCMD contacted and pt placed on cardiac monitor at this time.

## 2023-11-13 NOTE — Discharge Instructions (Addendum)
 You were seen in the emergency department for chest pain.  Workup today was reassuring and you are safe to go home.  Please do your best to cut back on your smoking and alcohol use slowly.  I have placed referrals for a primary care physician and a cardiologist and please answer your phone to help arrange.  Return with any acutely worsening symptoms or any other emergency. -- RETURN PRECAUTIONS & AFTERCARE: (ENGLISH) RETURN PRECAUTIONS: Return immediately to the emergency department or see/call your doctor if you feel worse, weak or have changes in speech or vision, are short of breath, have fever, vomiting, pain, bleeding or dark stool, trouble urinating or any new issues. Return here or see/call your doctor if not improving as expected for your suspected condition. FOLLOW-UP CARE: Call your doctor and/or any doctors we referred you to for more advice and to make an appointment. Do this today, tomorrow or after the weekend. Some doctors only take PPO insurance so if you have HMO insurance you may want to contact your HMO or your regular doctor for referral to a specialist within your plan. Either way tell the doctor's office that it was a referral from the emergency department so you get the soonest possible appointment.  YOUR TEST RESULTS: Take result reports of any blood or urine tests, imaging tests and EKG's to your doctor and any referral doctor. Have any abnormal tests repeated. Your doctor or a referral doctor can let you know when this should be done. Also make sure your doctor contacts this hospital to get any test results that are not currently available such as cultures or special tests for infection and final imaging reports, which are often not available at the time you leave the ER but which may list additional important findings that are not documented on the preliminary report. BLOOD PRESSURE: If your blood pressure was greater than 120/80 have your blood pressure rechecked within 1 to 2  weeks. MEDICATION SIDE EFFECTS: Do not drive, walk, bike, take the bus, etc. if you have received or are being prescribed any sedating medications such as those for pain or anxiety or certain antihistamines like Benadryl . If you have been give one of these here get a taxi home or have a friend drive you home. Ask your pharmacist to counsel you on potential side effects of any new medication

## 2023-11-13 NOTE — ED Notes (Signed)
 Pt given warm blanket and cup of water upon request. NAD.

## 2023-11-13 NOTE — ED Provider Notes (Signed)
 [facility] Provider Note    Event Date/Time   First MD Initiated Contact with Patient 11/13/23 [phone]     (approximate)   History   Chest Pain   HPI  Ross Reed is a 58 y.o. male with a prior history of splenectomy, daily alcohol dependence, ongoing smoking, does not follow with a primary care physician regularly who presents with 16 hours of intermittent chest pain in the setting of 1 month of cough.  Patient states that he was working in his yard yesterday when chest pain occurred.  It happened after a coughing fit and will occur several times he coughs on his left side.  Reports mild shortness of breath.  Denies any fevers or chills abdominal pain changes in urinary or bowel habits.      Physical Exam   Triage Vital Signs: ED Triage Vitals  Encounter Vitals Group     BP 11/13/23 0634 (!) 148/80     Girls Systolic BP Percentile --      Girls Diastolic BP Percentile --      Boys Systolic BP Percentile --      Boys Diastolic BP Percentile --      Pulse Rate 11/13/23 0634 75     Resp 11/13/23 0634 16     Temp 11/13/23 0634 97.7 F (36.5 C)     Temp Source 11/13/23 0634 Oral     SpO2 11/13/23 0634 96 %     Weight 11/13/23 0635 198 lb (89.8 kg)     Height 11/13/23 0635 6' 2 (1.88 m)     Head Circumference --      Peak Flow --      Pain Score 11/13/23 0635 8     Pain Loc --      Pain Education --      Exclude from Growth Chart --     Most recent vital signs: Vitals:   11/13/23 0900 11/13/23 0930  BP: (!) 136/91 136/86  Pulse: 82 74  Resp: 15 17  Temp:    SpO2: 95% 94%    Nursing Triage Note reviewed. Vital signs reviewed and patients oxygen saturation is normoxic  General: Patient is well nourished, well developed, awake and alert, resting comfortably in no acute distress Head: Normocephalic and atraumatic Eyes: Normal inspection, extraocular muscles intact, no conjunctival pallor Ear, nose, throat: Normal external exam Dry mucous  membranes Neck: Normal range of motion Respiratory: Patient is in no respiratory distress, lungs bilateral wheezes in apices Cardiovascular: Patient is not tachycardic, RRR without murmur appreciated GI: Abd SNT with no guarding or rebound  Back: Normal inspection of the back with good strength and range of motion throughout all ext Extremities: pulses intact with good cap refills, no LE pitting edema or calf tenderness Neuro: The patient is alert and oriented to person, place, and time, appropriately conversive, with 5/5 bilat UE/LE strength, no gross motor or sensory defects noted. Coordination appears to be adequate. Skin: Warm, dry, and intact Psych: normal mood and affect, no SI or HI  ED Results / Procedures / Treatments   Labs (all labs ordered are listed, but only abnormal results are displayed) Labs Reviewed  BASIC METABOLIC PANEL WITH GFR - Abnormal; Notable for the following components:      Result Value   Glucose, Bld 101 (*)    All other components within normal limits  CBC - Abnormal; Notable for the following components:   WBC 12.2 (*)    All other  components within normal limits  HEPATIC FUNCTION PANEL - Abnormal; Notable for the following components:   Albumin 3.4 (*)    AST 48 (*)    ALT 51 (*)    All other components within normal limits  LIPASE, BLOOD  TROPONIN I (HIGH SENSITIVITY)  TROPONIN I (HIGH SENSITIVITY)     EKG EKG and rhythm strip are interpreted by myself:   EKG: [Normal sinus rhythm] at heart rate of 71, normal QRS duration, QTc 441, nonspecific ST segments and T waves no ectopy EKG not consistent with Acute STEMI Rhythm strip: NSR in lead II   RADIOLOGY Xray chest: No acute abnormality on my independent review interpretation radiologist agrees    PROCEDURES:  Critical Care performed: No  Procedures   MEDICATIONS ORDERED IN ED: Medications  aspirin  chewable tablet 324 mg (324 mg Oral Given 11/13/23 0654)  ipratropium-albuterol   (DUONEB) 0.5-2.5 (3) MG/3ML nebulizer solution 3 mL (3 mLs Nebulization Given 11/13/23 0730)     IMPRESSION / MDM / ASSESSMENT AND PLAN / ED COURSE                                Differential diagnosis includes, but is not limited to, ACS, pneumonia, gastritis, pleuritis, anemia   ED course: Patient presents and reassured that initial EKG demonstrates no acute ischemia.  Troponin x 2 was not elevated.  Chest x-ray was unremarkable.  Given mild wheezing and APCs and ongoing smoking he received a DuoNeb which improved symptoms.  Given his drinking I am concerned about gastritis.  He will go with a course of Pepcid .  He was counseled to slowly cut back on his drinking and smoking and to follow-up with primary care physician.  I have placed an outpatient referral to cardiology as well.  All questions answered and patient voiced understanding and requested discharge.  At time of discharge there is no evidence of acute life, limb, vision, or fertility threat. Patient has stable vital signs, pain is well controlled, patient is ambulatory and p.o. tolerant.  Discharge instructions were completed using the [employer] system. I would refer you to those at this time. All warnings prescriptions follow-up etc. were discussed in detail with the patient. Patient indicates understanding and is agreeable with this plan. All questions answered.  Patient is made aware that they may return to the emergency department for any worsening or new condition or for any other emergency.    Clinical Course as of 11/13/23 1044  Mon Nov 13, 2023  0727 Troponin I (High Sensitivity): 6 Not elevated [HD]  0727 WBC(!): 12.2 Mild leukocytosis [HD]  0827 Hepatic function panel(!) No significant elevation and only mild elevation may be secondary to daily alcohol use [HD]  0827 Patient reexamined and resting comfortably feels reassured with his workup thus far [HD]  0928 Troponin I (High Sensitivity): 5 Not elevated, will work  DC with cardiology follow up [HD]  9060 Patient reassessed reviewed discharge instructions.  Will place referrals to cardiology and primary care and trial the patient on Pepcid  [HD]    Clinical Course User Index [HD] Nicholaus Rolland BRAVO, MD     FINAL CLINICAL IMPRESSION(S) / ED DIAGNOSES   Final diagnoses:  Chest pain due to myocardial ischemia, unspecified ischemic chest pain type  Alcohol dependence with unspecified alcohol-induced disorder (HCC)  Smoking hx  Chest pain, unspecified type     Rx / DC Orders   ED Discharge Orders  Ordered    famotidine  (PEPCID ) 20 MG tablet  Daily        11/13/23 0940    Ambulatory referral to Cardiology  Status:  Canceled       Comments: If you have not heard from the Cardiology office within the next 72 hours please call [phone].   11/13/23 0941    Ambulatory Referral to Primary Care (Establish Care)        11/13/23 0941    Ambulatory referral to Cardiology       Comments: If you have not heard from the Cardiology office within the next 72 hours please call [phone].   11/13/23 9057             Note:  This document was prepared using Dragon voice recognition software and may include unintentional dictation errors.   Nicholaus Rolland BRAVO, MD 11/13/23 1044
